# Patient Record
Sex: Female | Born: 1945 | Race: Black or African American | Hispanic: No | Marital: Single | Smoking: Former smoker
Health system: Southern US, Community
[De-identification: ages and names within clinical notes are randomized; demographics above are authoritative.]

## PROBLEM LIST (undated history)

## (undated) DIAGNOSIS — G709 Myoneural disorder, unspecified: Secondary | ICD-10-CM

## (undated) DIAGNOSIS — E785 Hyperlipidemia, unspecified: Secondary | ICD-10-CM

## (undated) DIAGNOSIS — M1990 Unspecified osteoarthritis, unspecified site: Secondary | ICD-10-CM

## (undated) DIAGNOSIS — I959 Hypotension, unspecified: Secondary | ICD-10-CM

## (undated) DIAGNOSIS — C801 Malignant (primary) neoplasm, unspecified: Secondary | ICD-10-CM

## (undated) DIAGNOSIS — E119 Type 2 diabetes mellitus without complications: Secondary | ICD-10-CM

## (undated) DIAGNOSIS — I10 Essential (primary) hypertension: Secondary | ICD-10-CM

## (undated) DIAGNOSIS — D649 Anemia, unspecified: Secondary | ICD-10-CM

## (undated) DIAGNOSIS — I82409 Acute embolism and thrombosis of unspecified deep veins of unspecified lower extremity: Secondary | ICD-10-CM

## (undated) DIAGNOSIS — E079 Disorder of thyroid, unspecified: Secondary | ICD-10-CM

## (undated) DIAGNOSIS — I739 Peripheral vascular disease, unspecified: Secondary | ICD-10-CM

## (undated) DIAGNOSIS — N189 Chronic kidney disease, unspecified: Secondary | ICD-10-CM

## (undated) DIAGNOSIS — I509 Heart failure, unspecified: Secondary | ICD-10-CM

## (undated) DIAGNOSIS — R0600 Dyspnea, unspecified: Secondary | ICD-10-CM

## (undated) DIAGNOSIS — I499 Cardiac arrhythmia, unspecified: Secondary | ICD-10-CM

## (undated) DIAGNOSIS — G4730 Sleep apnea, unspecified: Secondary | ICD-10-CM

## (undated) DIAGNOSIS — I639 Cerebral infarction, unspecified: Secondary | ICD-10-CM

## (undated) DIAGNOSIS — Z950 Presence of cardiac pacemaker: Secondary | ICD-10-CM

## (undated) HISTORY — DX: Peripheral vascular disease, unspecified: I73.9

## (undated) HISTORY — PX: PACEMAKER PLACEMENT: SHX43

## (undated) HISTORY — PX: AV FISTULA PLACEMENT: SHX1204

## (undated) HISTORY — DX: Myoneural disorder, unspecified: G70.9

## (undated) HISTORY — DX: Unspecified osteoarthritis, unspecified site: M19.90

## (undated) HISTORY — PX: ABDOMINAL HYSTERECTOMY: SHX81

## (undated) HISTORY — DX: Type 2 diabetes mellitus without complications: E11.9

## (undated) HISTORY — DX: Acute embolism and thrombosis of unspecified deep veins of unspecified lower extremity: I82.409

## (undated) HISTORY — DX: Chronic kidney disease, unspecified: N18.9

## (undated) HISTORY — PX: EYE SURGERY: SHX253

## (undated) HISTORY — DX: Heart failure, unspecified: I50.9

## (undated) HISTORY — PX: COLONOSCOPY W/ POLYPECTOMY: SHX1380

## (undated) HISTORY — DX: Hyperlipidemia, unspecified: E78.5

## (undated) HISTORY — DX: Essential (primary) hypertension: I10

## (undated) HISTORY — PX: ANKLE FUSION: SHX881

## (undated) HISTORY — DX: Disorder of thyroid, unspecified: E07.9

## (undated) HISTORY — PX: BREAST LUMPECTOMY: SHX2

## (undated) HISTORY — DX: Cerebral infarction, unspecified: I63.9

---

## 1999-03-22 ENCOUNTER — Encounter: Admission: RE | Admit: 1999-03-22 | Discharge: 1999-03-22 | Payer: Self-pay | Admitting: Family Medicine

## 1999-03-23 ENCOUNTER — Encounter: Payer: Self-pay | Admitting: Family Medicine

## 1999-03-23 ENCOUNTER — Ambulatory Visit (HOSPITAL_COMMUNITY): Admission: RE | Admit: 1999-03-23 | Discharge: 1999-03-23 | Payer: Self-pay | Admitting: Family Medicine

## 1999-03-24 ENCOUNTER — Encounter: Admission: RE | Admit: 1999-03-24 | Discharge: 1999-03-24 | Payer: Self-pay | Admitting: Family Medicine

## 1999-04-09 ENCOUNTER — Encounter: Payer: Self-pay | Admitting: General Surgery

## 1999-04-12 ENCOUNTER — Inpatient Hospital Stay (HOSPITAL_COMMUNITY): Admission: RE | Admit: 1999-04-12 | Discharge: 1999-04-21 | Payer: Self-pay | Admitting: General Surgery

## 1999-04-12 ENCOUNTER — Encounter: Payer: Self-pay | Admitting: General Surgery

## 1999-04-22 ENCOUNTER — Encounter: Admission: RE | Admit: 1999-04-22 | Discharge: 1999-04-22 | Payer: Self-pay | Admitting: Family Medicine

## 1999-05-04 ENCOUNTER — Encounter: Admission: RE | Admit: 1999-05-04 | Discharge: 1999-05-04 | Payer: Self-pay | Admitting: Family Medicine

## 1999-07-05 ENCOUNTER — Encounter: Admission: RE | Admit: 1999-07-05 | Discharge: 1999-07-05 | Payer: Self-pay | Admitting: Family Medicine

## 2000-07-03 ENCOUNTER — Encounter: Payer: Self-pay | Admitting: Emergency Medicine

## 2000-07-03 ENCOUNTER — Emergency Department (HOSPITAL_COMMUNITY): Admission: EM | Admit: 2000-07-03 | Discharge: 2000-07-03 | Payer: Self-pay | Admitting: Emergency Medicine

## 2000-07-06 ENCOUNTER — Encounter: Admission: RE | Admit: 2000-07-06 | Discharge: 2000-07-06 | Payer: Self-pay | Admitting: Hematology and Oncology

## 2006-12-28 ENCOUNTER — Ambulatory Visit (HOSPITAL_COMMUNITY): Admission: RE | Admit: 2006-12-28 | Discharge: 2006-12-29 | Payer: Self-pay | Admitting: Ophthalmology

## 2008-11-18 IMAGING — CR DG CHEST 2V
2 series · 2 of 2 positions shown · non-contrast
Comparison: Report dated 07/03/2000.

CLINICAL DATA: Traction retinal attachment. Preoperative evaluation.

CHEST - 2 VIEW

[view not recorded (1 of 2)]
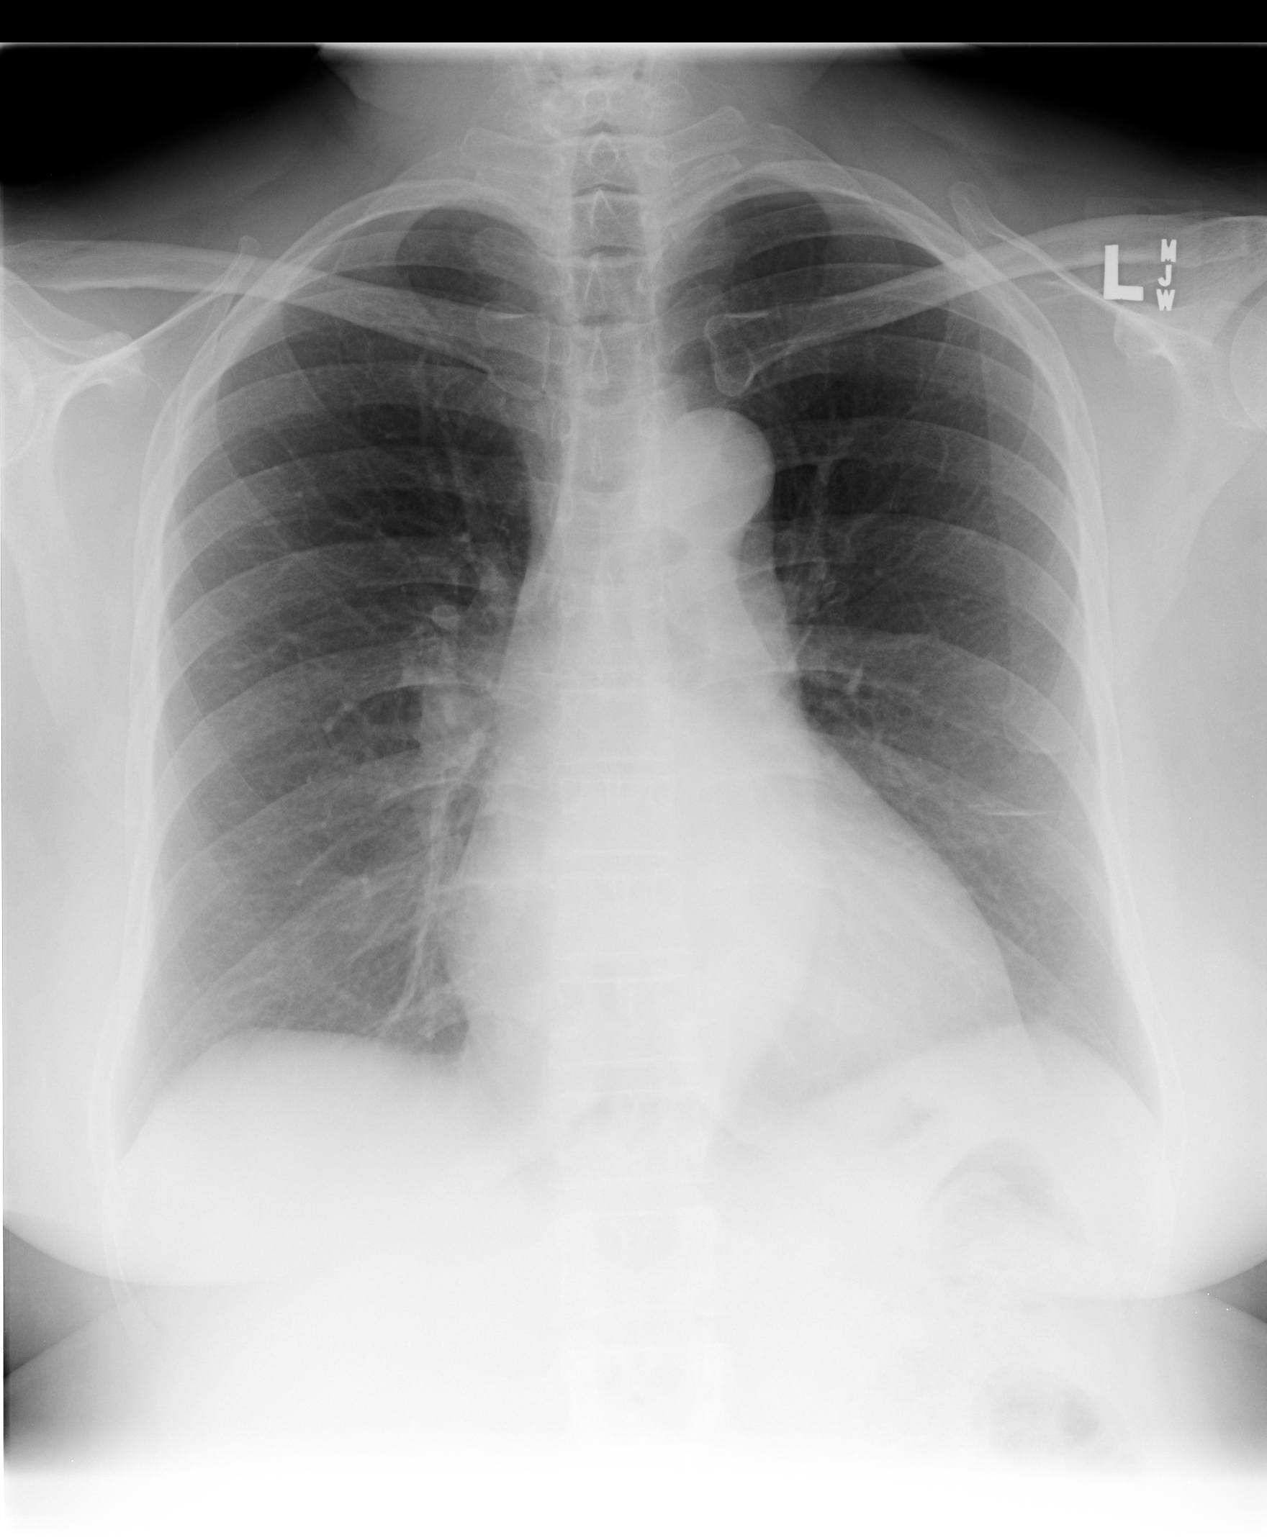

[view not recorded (2 of 2)]
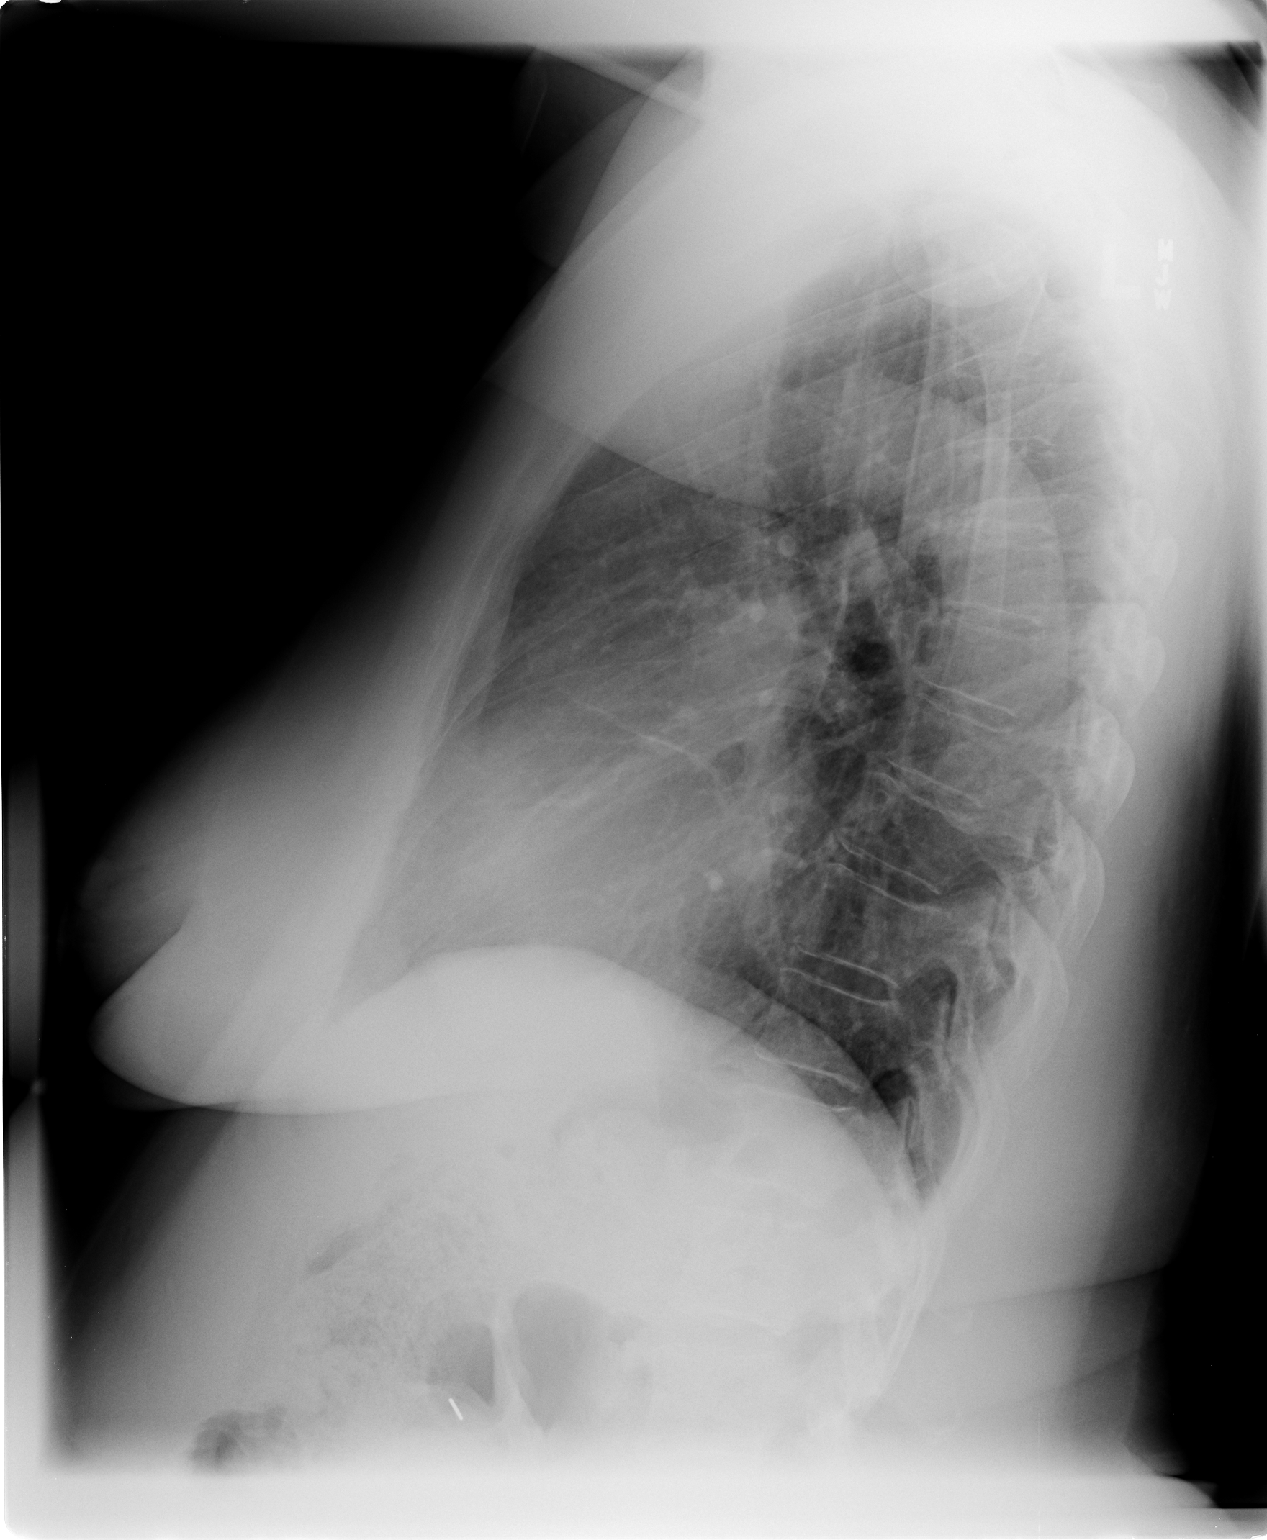

[2 of 2 positions shown; findings below may reference images not displayed]

FINDINGS: Mildly enlarged cardiac silhouette. Tortuous aorta. Small amount of
linear density in the lingula. Mild diffuse peribronchial thickening.
Unremarkable bones.

IMPRESSION

1. Mild cardiomegaly.
2. Mild chronic bronchitic changes.
3. Small amount of lingular scarring.

## 2012-04-19 DIAGNOSIS — Z86010 Personal history of colonic polyps: Secondary | ICD-10-CM | POA: Insufficient documentation

## 2012-11-08 DIAGNOSIS — E113599 Type 2 diabetes mellitus with proliferative diabetic retinopathy without macular edema, unspecified eye: Secondary | ICD-10-CM | POA: Insufficient documentation

## 2012-11-28 DIAGNOSIS — I4891 Unspecified atrial fibrillation: Secondary | ICD-10-CM | POA: Insufficient documentation

## 2013-03-05 DIAGNOSIS — Z7901 Long term (current) use of anticoagulants: Secondary | ICD-10-CM | POA: Insufficient documentation

## 2013-04-18 DIAGNOSIS — I872 Venous insufficiency (chronic) (peripheral): Secondary | ICD-10-CM | POA: Insufficient documentation

## 2013-04-18 DIAGNOSIS — E1149 Type 2 diabetes mellitus with other diabetic neurological complication: Secondary | ICD-10-CM | POA: Insufficient documentation

## 2014-08-07 DIAGNOSIS — Z981 Arthrodesis status: Secondary | ICD-10-CM | POA: Insufficient documentation

## 2014-08-07 DIAGNOSIS — E1149 Type 2 diabetes mellitus with other diabetic neurological complication: Secondary | ICD-10-CM | POA: Insufficient documentation

## 2014-12-26 DIAGNOSIS — N6092 Unspecified benign mammary dysplasia of left breast: Secondary | ICD-10-CM | POA: Insufficient documentation

## 2015-03-11 DIAGNOSIS — Z950 Presence of cardiac pacemaker: Secondary | ICD-10-CM

## 2015-03-11 HISTORY — DX: Presence of cardiac pacemaker: Z95.0

## 2015-03-21 HISTORY — PX: PACEMAKER PLACEMENT: SHX43

## 2016-07-21 ENCOUNTER — Encounter: Payer: Self-pay | Admitting: Vascular Surgery

## 2016-07-21 ENCOUNTER — Ambulatory Visit: Payer: Medicare HMO | Admitting: Vascular Surgery

## 2016-09-05 ENCOUNTER — Encounter: Payer: Self-pay | Admitting: Vascular Surgery

## 2016-09-09 ENCOUNTER — Ambulatory Visit (INDEPENDENT_AMBULATORY_CARE_PROVIDER_SITE_OTHER): Payer: Medicare HMO | Admitting: Vascular Surgery

## 2016-09-09 ENCOUNTER — Telehealth: Payer: Self-pay

## 2016-09-09 ENCOUNTER — Encounter: Payer: Self-pay | Admitting: Vascular Surgery

## 2016-09-09 VITALS — BP 150/72 | HR 60 | Temp 97.2°F | Resp 18 | Ht 63.0 in | Wt 180.0 lb

## 2016-09-09 DIAGNOSIS — Z992 Dependence on renal dialysis: Secondary | ICD-10-CM

## 2016-09-09 DIAGNOSIS — I871 Compression of vein: Secondary | ICD-10-CM | POA: Diagnosis not present

## 2016-09-09 DIAGNOSIS — T82898D Other specified complication of vascular prosthetic devices, implants and grafts, subsequent encounter: Secondary | ICD-10-CM

## 2016-09-09 DIAGNOSIS — T82510D Breakdown (mechanical) of surgically created arteriovenous fistula, subsequent encounter: Secondary | ICD-10-CM | POA: Insufficient documentation

## 2016-09-09 DIAGNOSIS — N186 End stage renal disease: Secondary | ICD-10-CM | POA: Diagnosis not present

## 2016-09-09 NOTE — Progress Notes (Signed)
Referred by:  Dr. Augustin Coupe (Nephrology)  Reason for referral: Left cephalic vein turndown  History of Present Illness  Stacy Hammond is a 70 y.o. ([birth year]) female who presents for evaluation for permanent access.  The patient is right hand dominant.  The patient current dialyzes via a L BC AVF.  She denies any bleeding complications or steal syndrome.  She recently had a L arm fistulogram with venoplasty.  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  Past Medical History:  Diagnosis Date  . Arthritis   . CHF (congestive heart failure) ([place])   . Chronic kidney disease   . Diabetes mellitus without complication ([place])   . DVT (deep venous thrombosis) ([place])   . Hyperlipidemia   . Hypertension   . Neuromuscular disorder ([place])   . Peripheral arterial disease ([place])   . Stroke ([place])   . Thyroid disease     Past Surgical History:  Procedure Laterality Date  . ABDOMINAL HYSTERECTOMY     partial  . ANKLE FUSION Right 2014?   done at [facility]  . AV FISTULA PLACEMENT    . BREAST LUMPECTOMY    . EYE SURGERY      Social History   Social History  . Marital status: Single    Spouse name: N/A  . Number of children: N/A  . Years of education: N/A   Occupational History  . Not on file.   Social History Main Topics  . Smoking status: Former Smoker    Types: Cigarettes    Quit date: 09/09/1986  . Smokeless tobacco: Never Used  . Alcohol use Not on file  . Drug use: Unknown  . Sexual activity: Not on file   Other Topics Concern  . Not on file   Social History Narrative  . No narrative on file    Family History: patient is unable to detail the medical history of his parents   Current Outpatient Prescriptions  Medication Sig Dispense Refill  . aspirin EC 81 MG tablet Take 81 mg by mouth.    . cinacalcet (SENSIPAR) 60 MG tablet     . docusate sodium (COLACE) 100 MG capsule Take 100 mg by mouth.    .  pravastatin (PRAVACHOL) 20 MG tablet Take 20 mg by mouth.    . sucroferric oxyhydroxide (VELPHORO) 500 MG chewable tablet Chew 1,000 mg by mouth 3 (three) times daily with meals.    . warfarin (COUMADIN) 5 MG tablet Take as directed.    . ferric citrate (AURYXIA) 1 GM 210 MG(Fe) tablet      No current facility-administered medications for this visit.     Allergies  Allergen Reactions  . Penicillins Hives  . Oxycodone Nausea And Vomiting and Nausea Only     REVIEW OF SYSTEMS:   Cardiac:  positive for: chest pain or pressure, irregular rate and rhythm, DOE, negative for: Shortness of breath when lying flat,   Vascular:  positive for: Pain in calf, thigh, or hip brought on by ambulation,  negative for: Pain in feet at night that wakes you up from your sleep, Blood clot in your veins and Leg swelling  Pulmonary:  positive for: no symptoms,  negative for: Oxygen at home, Productive cough and Wheezing  Neurologic:  positive for: Sudden weakness in arms or legs, negative for: Sudden numbness in arms or legs, Sudden onset of difficulty speaking or slurred speech, Temporary loss of vision in one  eye and Problems with dizziness  Gastrointestinal:  positive for: no symptoms, negative for: Blood in stool and Vomited blood  Genitourinary:  positive for: no symptoms, negative for: Burning when urinating and Blood in urine  Psychiatric:  positive for: no symptoms,  negative for: Major depression  Hematologic:  positive for: no symptoms,  negative for: negative for: Bleeding problems and Problems with blood clotting too easily  Dermatologic:  positive for: no symptoms, negative for: Rashes or ulcers  Constitutional:  positive for: no symptoms, negative for: Fever or chills  Ear/Nose/Throat:  positive for: no symptoms, negative for: Change in hearing, Nose bleeds and Sore throat  Musculoskeletal:  positive for: no symptoms, negative for: Back pain, Joint pain and Muscle  pain   Physical Examination  Vitals:   09/09/16 1039 09/09/16 1043  BP: (!) 174/84 (!) 150/72  Pulse: 60 60  Resp: 18   Temp: 97.2 F (36.2 C)   TempSrc: Oral   SpO2: 100%   Weight: 180 lb (81.6 kg)   Height: 5\' 3"  (1.6 m)     Body mass index is 31.89 kg/m.  General: Alert, O x 3, WD,NAD  Head: [place]/AT,   Ear/Nose/Throat: Hearing grossly intact, nares without erythema or drainage, oropharynx without Erythema or Exudate , Mallampati score: 3, Dentition intact  Eyes: PERRLA, EOMI,   Neck: Supple, mid-line trachea,    Pulmonary: Sym exp, good B air movt,CTA B  Cardiac: RRR, Nl S1, S2, no Murmurs, No rubs, No S3,S4  Vascular: Vessel Right Left  Radial Palpable Not palpable  Brachial Palpable Palpable  Carotid Palpable, No Bruit Palpable, No Bruit  Aorta Not palpable N/A  Femoral Palpable Palpable  Popliteal Not palpable Not palpable  PT Not palpable Not palpable  DP Not palpable Not palpable   Gastrointestinal: soft, non-distended, non-tender to palpation, No guarding or rebound, no HSM, no masses, no CVAT B, No palpable prominent aortic pulse,    Musculoskeletal: M/S 5/5 throughout  , Extremities without ischemic changes  , No edema present, No LDS present, L arm with strong bruit and thrill in the L BC AVF, pulsatile proximal fistula, on Sonosite: 5-6 mm brachial vein in axilla  Neurologic: CN 2-12 intact , Pain and light touch intact in extremities , Motor exam as listed above  Psychiatric: Judgement intact, Mood & affect appropriate for pt's clinical situation  Dermatologic: See M/S exam for extremity exam, No rashes otherwise noted  Lymph : Palpable lymph nodes: None  Outside L arm fistulogram Based my review of the fistulogram, the patient has a proximal cephalic stenosis at confluence and 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  The distal fistula is disease also.   Outside Studies/Documentation 7 pages of outside documents were reviewed including:  nephrology chart, fistulogram report.   Medical Decision Making  Stacy Hammond is a 70 y.o. female who presents with ESRD requiring hemodialysis, stenotic proximal cephalic vein    I agree that this patient's cephalic vein stenosis is best managed with a L cephalic vein turndown. Risk, benefits, and alternatives to access surgery were discussed.   The patient is aware the risks include but are not limited to: bleeding, infection, steal syndrome, nerve damage, ischemic monomelic neuropathy, failure to mature, need for additional procedures, death and stroke.  The patient agrees to proceed forward with the procedure.  The patient has agreed to proceed with the above procedure which will be scheduled 13 DEC 17.   Adele Barthel, MD [facility] Office:  [phone] Pager: [phone]  09/09/2016, 11:26 AM

## 2016-09-09 NOTE — Telephone Encounter (Signed)
Opened in error

## 2016-09-12 ENCOUNTER — Other Ambulatory Visit: Payer: Self-pay

## 2016-09-13 ENCOUNTER — Encounter (HOSPITAL_COMMUNITY): Payer: Self-pay | Admitting: *Deleted

## 2016-09-13 NOTE — Anesthesia Preprocedure Evaluation (Addendum)
Anesthesia Evaluation  Patient identified by MRN, date of birth, ID band Patient awake    Reviewed: Allergy & Precautions, NPO status , Patient's Chart, lab work & pertinent test results  History of Anesthesia Complications Negative for: history of anesthetic complications  Airway Mallampati: II  TM Distance: >3 FB Neck ROM: Full    Dental  (+) Partial Lower, Partial Upper, Dental Advisory Given   Pulmonary neg pulmonary ROS, former smoker,    Pulmonary exam normal        Cardiovascular hypertension, + Peripheral Vascular Disease and +CHF  negative cardio ROS Normal cardiovascular exam+ dysrhythmias Atrial Fibrillation + pacemaker      Neuro/Psych CVA    GI/Hepatic negative GI ROS, Neg liver ROS,   Endo/Other  negative endocrine ROSdiabetes  Renal/GU ESRF and DialysisRenal disease     Musculoskeletal negative musculoskeletal ROS (+)   Abdominal   Peds  Hematology negative hematology ROS (+)   Anesthesia Other Findings Day of surgery medications reviewed with the patient.  Reproductive/Obstetrics                            Anesthesia Physical Anesthesia Plan  ASA: III  Anesthesia Plan: General   Post-op Pain Management:    Induction: Intravenous  Airway Management Planned: LMA  Additional Equipment:   Intra-op Plan:   Post-operative Plan: Extubation in OR  Informed Consent: I have reviewed the patients History and Physical, chart, labs and discussed the procedure including the risks, benefits and alternatives for the proposed anesthesia with the patient or authorized representative who has indicated his/her understanding and acceptance.   Dental advisory given  Plan Discussed with: CRNA and Anesthesiologist  Anesthesia Plan Comments:       Anesthesia Quick Evaluation

## 2016-09-13 NOTE — Progress Notes (Signed)
There are instuctions from Dr Bridgett Larsson office requesting that we call daughter Denman George.  Denman George reports that patient just arrived home from Hemodialysis and is sleeping and generally  sleeps until after 7 PM. Lolanda verified patients name and DOB and surgical procedure.   Denman George reports that patient has had been expericing  dizziness for a while, denies shortness of breath or nausea with the dizziness. Adrian Prows states that patient has an appointment with cardiologist in January, she states that only the PCP is aware of dizziness.  I find in notes from cardiologist that the dizziness was discussed at 12/2015 appointment and he was going to have her follow up with nephrologist.

## 2016-09-14 ENCOUNTER — Ambulatory Visit (HOSPITAL_COMMUNITY)
Admission: RE | Admit: 2016-09-14 | Discharge: 2016-09-14 | Disposition: A | Discharge status: Home / Self Care | Payer: Medicare HMO | Source: Ambulatory Visit | Attending: Vascular Surgery | Admitting: Vascular Surgery

## 2016-09-14 ENCOUNTER — Encounter (HOSPITAL_COMMUNITY): Payer: Self-pay | Admitting: *Deleted

## 2016-09-14 ENCOUNTER — Ambulatory Visit (HOSPITAL_COMMUNITY): Payer: Medicare HMO | Admitting: Anesthesiology

## 2016-09-14 ENCOUNTER — Encounter (HOSPITAL_COMMUNITY)
Admission: RE | Disposition: A | Discharge status: Home / Self Care | Payer: Self-pay | Source: Ambulatory Visit | Attending: Vascular Surgery

## 2016-09-14 DIAGNOSIS — I4891 Unspecified atrial fibrillation: Secondary | ICD-10-CM | POA: Diagnosis not present

## 2016-09-14 DIAGNOSIS — Z87891 Personal history of nicotine dependence: Secondary | ICD-10-CM | POA: Insufficient documentation

## 2016-09-14 DIAGNOSIS — T82898A Other specified complication of vascular prosthetic devices, implants and grafts, initial encounter: Secondary | ICD-10-CM | POA: Diagnosis not present

## 2016-09-14 DIAGNOSIS — X58XXXA Exposure to other specified factors, initial encounter: Secondary | ICD-10-CM | POA: Diagnosis not present

## 2016-09-14 DIAGNOSIS — Z79899 Other long term (current) drug therapy: Secondary | ICD-10-CM | POA: Diagnosis not present

## 2016-09-14 DIAGNOSIS — E079 Disorder of thyroid, unspecified: Secondary | ICD-10-CM | POA: Insufficient documentation

## 2016-09-14 DIAGNOSIS — Z950 Presence of cardiac pacemaker: Secondary | ICD-10-CM | POA: Insufficient documentation

## 2016-09-14 DIAGNOSIS — Z992 Dependence on renal dialysis: Secondary | ICD-10-CM | POA: Diagnosis not present

## 2016-09-14 DIAGNOSIS — Z7982 Long term (current) use of aspirin: Secondary | ICD-10-CM | POA: Diagnosis not present

## 2016-09-14 DIAGNOSIS — Z8673 Personal history of transient ischemic attack (TIA), and cerebral infarction without residual deficits: Secondary | ICD-10-CM | POA: Diagnosis not present

## 2016-09-14 DIAGNOSIS — E1122 Type 2 diabetes mellitus with diabetic chronic kidney disease: Secondary | ICD-10-CM | POA: Diagnosis not present

## 2016-09-14 DIAGNOSIS — Z880 Allergy status to penicillin: Secondary | ICD-10-CM | POA: Insufficient documentation

## 2016-09-14 DIAGNOSIS — Z885 Allergy status to narcotic agent status: Secondary | ICD-10-CM | POA: Diagnosis not present

## 2016-09-14 DIAGNOSIS — G709 Myoneural disorder, unspecified: Secondary | ICD-10-CM | POA: Insufficient documentation

## 2016-09-14 DIAGNOSIS — Z7901 Long term (current) use of anticoagulants: Secondary | ICD-10-CM | POA: Diagnosis not present

## 2016-09-14 DIAGNOSIS — E1151 Type 2 diabetes mellitus with diabetic peripheral angiopathy without gangrene: Secondary | ICD-10-CM | POA: Diagnosis not present

## 2016-09-14 DIAGNOSIS — Z86718 Personal history of other venous thrombosis and embolism: Secondary | ICD-10-CM | POA: Diagnosis not present

## 2016-09-14 DIAGNOSIS — N186 End stage renal disease: Secondary | ICD-10-CM | POA: Diagnosis present

## 2016-09-14 DIAGNOSIS — I132 Hypertensive heart and chronic kidney disease with heart failure and with stage 5 chronic kidney disease, or end stage renal disease: Secondary | ICD-10-CM | POA: Diagnosis not present

## 2016-09-14 DIAGNOSIS — Z90710 Acquired absence of both cervix and uterus: Secondary | ICD-10-CM | POA: Insufficient documentation

## 2016-09-14 DIAGNOSIS — E785 Hyperlipidemia, unspecified: Secondary | ICD-10-CM | POA: Diagnosis not present

## 2016-09-14 DIAGNOSIS — I82612 Acute embolism and thrombosis of superficial veins of left upper extremity: Secondary | ICD-10-CM | POA: Diagnosis not present

## 2016-09-14 DIAGNOSIS — I509 Heart failure, unspecified: Secondary | ICD-10-CM | POA: Diagnosis not present

## 2016-09-14 HISTORY — DX: Presence of cardiac pacemaker: Z95.0

## 2016-09-14 HISTORY — DX: Dyspnea, unspecified: R06.00

## 2016-09-14 HISTORY — DX: Malignant (primary) neoplasm, unspecified: C80.1

## 2016-09-14 HISTORY — PX: REVISON OF ARTERIOVENOUS FISTULA: SHX6074

## 2016-09-14 LAB — GLUCOSE, CAPILLARY
GLUCOSE-CAPILLARY: 72 mg/dL (ref 65–99)
GLUCOSE-CAPILLARY: 96 mg/dL (ref 65–99)

## 2016-09-14 LAB — POCT I-STAT 4, (NA,K, GLUC, HGB,HCT)
GLUCOSE: 86 mg/dL (ref 65–99)
HCT: 37 % (ref 36.0–46.0)
Hemoglobin: 12.6 g/dL (ref 12.0–15.0)
Potassium: 3.5 mmol/L (ref 3.5–5.1)
Sodium: 141 mmol/L (ref 135–145)

## 2016-09-14 LAB — PROTIME-INR
INR: 1.07
PROTHROMBIN TIME: 13.9 s (ref 11.4–15.2)

## 2016-09-14 SURGERY — REVISON OF ARTERIOVENOUS FISTULA
Anesthesia: General | Site: Arm Upper | Laterality: Left

## 2016-09-14 MED ORDER — VANCOMYCIN HCL IN DEXTROSE 1-5 GM/200ML-% IV SOLN
INTRAVENOUS | Status: AC
  Filled 2016-09-14: qty 200

## 2016-09-14 MED ORDER — VANCOMYCIN HCL IN DEXTROSE 1-5 GM/200ML-% IV SOLN
1000.0000 mg | INTRAVENOUS | Status: AC
  Administered 2016-09-14: 1000 mg via INTRAVENOUS

## 2016-09-14 MED ORDER — LIDOCAINE HCL (CARDIAC) 20 MG/ML IV SOLN
INTRAVENOUS | Status: DC | PRN
  Administered 2016-09-14: 100 mg via INTRAVENOUS

## 2016-09-14 MED ORDER — OXYCODONE-ACETAMINOPHEN 5-325 MG PO TABS: 1.0000 | ORAL_TABLET | Freq: Four times a day (QID) | ORAL | 0 refills | Status: DC | PRN

## 2016-09-14 MED ORDER — PHENYLEPHRINE HCL 10 MG/ML IJ SOLN
INTRAMUSCULAR | Status: DC | PRN
  Administered 2016-09-14 (×2): 40 ug via INTRAVENOUS
  Administered 2016-09-14: 80 ug via INTRAVENOUS
  Administered 2016-09-14: 40 ug via INTRAVENOUS
  Administered 2016-09-14: 80 ug via INTRAVENOUS

## 2016-09-14 MED ORDER — MIDAZOLAM HCL 2 MG/2ML IJ SOLN
INTRAMUSCULAR | Status: AC
  Filled 2016-09-14: qty 2

## 2016-09-14 MED ORDER — PROPOFOL 10 MG/ML IV BOLUS
INTRAVENOUS | Status: DC | PRN
  Administered 2016-09-14: 140 mg via INTRAVENOUS
  Administered 2016-09-14: 10 mg via INTRAVENOUS

## 2016-09-14 MED ORDER — LIDOCAINE 2% (20 MG/ML) 5 ML SYRINGE
INTRAMUSCULAR | Status: AC
  Filled 2016-09-14: qty 5

## 2016-09-14 MED ORDER — MIDAZOLAM HCL 5 MG/5ML IJ SOLN
INTRAMUSCULAR | Status: DC | PRN
  Administered 2016-09-14: 1 mg via INTRAVENOUS

## 2016-09-14 MED ORDER — ONDANSETRON HCL 4 MG/2ML IJ SOLN
INTRAMUSCULAR | Status: DC | PRN
  Administered 2016-09-14: 4 mg via INTRAVENOUS

## 2016-09-14 MED ORDER — 0.9 % SODIUM CHLORIDE (POUR BTL) OPTIME
TOPICAL | Status: DC | PRN
  Administered 2016-09-14: 1000 mL

## 2016-09-14 MED ORDER — FENTANYL CITRATE (PF) 100 MCG/2ML IJ SOLN
INTRAMUSCULAR | Status: AC
  Filled 2016-09-14: qty 2

## 2016-09-14 MED ORDER — ARTIFICIAL TEARS OP OINT
TOPICAL_OINTMENT | OPHTHALMIC | Status: AC
  Filled 2016-09-14: qty 3.5

## 2016-09-14 MED ORDER — SODIUM CHLORIDE 0.9 % IV SOLN
INTRAVENOUS | Status: DC
  Administered 2016-09-14 (×3): via INTRAVENOUS

## 2016-09-14 MED ORDER — HEMOSTATIC AGENTS (NO CHARGE) OPTIME
TOPICAL | Status: DC | PRN
  Administered 2016-09-14: 1 via TOPICAL

## 2016-09-14 MED ORDER — ONDANSETRON HCL 4 MG/2ML IJ SOLN
INTRAMUSCULAR | Status: AC
  Filled 2016-09-14: qty 2

## 2016-09-14 MED ORDER — LIDOCAINE HCL (PF) 1 % IJ SOLN
INTRAMUSCULAR | Status: DC | PRN
  Administered 2016-09-14: 30 mL

## 2016-09-14 MED ORDER — PROMETHAZINE HCL 25 MG/ML IJ SOLN: 6.2500 mg | INTRAMUSCULAR | Status: DC | PRN

## 2016-09-14 MED ORDER — FENTANYL CITRATE (PF) 100 MCG/2ML IJ SOLN
INTRAMUSCULAR | Status: DC | PRN
Start: 2016-09-14 — End: 2016-09-14
  Administered 2016-09-14 (×2): 50 ug via INTRAVENOUS

## 2016-09-14 MED ORDER — PROPOFOL 10 MG/ML IV BOLUS
INTRAVENOUS | Status: AC
  Filled 2016-09-14: qty 40

## 2016-09-14 MED ORDER — WARFARIN SODIUM 5 MG PO TABS: 5.0000 mg | ORAL_TABLET | Freq: Every day | ORAL | Status: DC

## 2016-09-14 MED ORDER — LIDOCAINE HCL (PF) 1 % IJ SOLN
INTRAMUSCULAR | Status: AC
  Filled 2016-09-14: qty 30

## 2016-09-14 MED ORDER — SODIUM CHLORIDE 0.9 % IV SOLN
INTRAVENOUS | Status: DC | PRN
  Administered 2016-09-14: 09:00:00

## 2016-09-14 MED ORDER — CHLORHEXIDINE GLUCONATE CLOTH 2 % EX PADS: 6.0000 | MEDICATED_PAD | Freq: Once | CUTANEOUS | Status: DC

## 2016-09-14 MED ORDER — HYDROMORPHONE HCL 1 MG/ML IJ SOLN: 0.2500 mg | INTRAMUSCULAR | Status: DC | PRN

## 2016-09-14 SURGICAL SUPPLY — 34 items
ARMBAND PINK RESTRICT EXTREMIT (MISCELLANEOUS) ×3 IMPLANT
CANISTER SUCTION 2500CC (MISCELLANEOUS) ×3 IMPLANT
CLIP TI MEDIUM 6 (CLIP) ×3 IMPLANT
CLIP TI WIDE RED SMALL 6 (CLIP) ×3 IMPLANT
COVER PROBE W GEL 5X96 (DRAPES) IMPLANT
DECANTER SPIKE VIAL GLASS SM (MISCELLANEOUS) ×3 IMPLANT
DERMABOND ADVANCED (GAUZE/BANDAGES/DRESSINGS) ×2
DERMABOND ADVANCED .7 DNX12 (GAUZE/BANDAGES/DRESSINGS) ×1 IMPLANT
DRAIN PENROSE 1/2X12 LTX STRL (WOUND CARE) IMPLANT
ELECT REM PT RETURN 9FT ADLT (ELECTROSURGICAL) ×3
ELECTRODE REM PT RTRN 9FT ADLT (ELECTROSURGICAL) ×1 IMPLANT
GLOVE BIO SURGEON STRL SZ7 (GLOVE) ×3 IMPLANT
GLOVE BIOGEL M 6.5 STRL (GLOVE) ×6 IMPLANT
GLOVE BIOGEL PI IND STRL 6.5 (GLOVE) ×2 IMPLANT
GLOVE BIOGEL PI IND STRL 7.0 (GLOVE) ×2 IMPLANT
GLOVE BIOGEL PI IND STRL 7.5 (GLOVE) ×1 IMPLANT
GLOVE BIOGEL PI INDICATOR 6.5 (GLOVE) ×4
GLOVE BIOGEL PI INDICATOR 7.0 (GLOVE) ×4
GLOVE BIOGEL PI INDICATOR 7.5 (GLOVE) ×2
GOWN STRL REUS W/ TWL LRG LVL3 (GOWN DISPOSABLE) ×3 IMPLANT
GOWN STRL REUS W/TWL LRG LVL3 (GOWN DISPOSABLE) ×6
HEMOSTAT SPONGE AVITENE ULTRA (HEMOSTASIS) ×3 IMPLANT
KIT BASIN OR (CUSTOM PROCEDURE TRAY) ×3 IMPLANT
KIT ROOM TURNOVER OR (KITS) ×3 IMPLANT
NS IRRIG 1000ML POUR BTL (IV SOLUTION) ×3 IMPLANT
PACK CV ACCESS (CUSTOM PROCEDURE TRAY) ×3 IMPLANT
PAD ARMBOARD 7.5X6 YLW CONV (MISCELLANEOUS) ×6 IMPLANT
SUT MNCRL AB 4-0 PS2 18 (SUTURE) ×6 IMPLANT
SUT PROLENE 6 0 BV (SUTURE) ×6 IMPLANT
SUT PROLENE 7 0 BV 1 (SUTURE) ×3 IMPLANT
SUT VIC AB 3-0 SH 27 (SUTURE) ×4
SUT VIC AB 3-0 SH 27X BRD (SUTURE) ×2 IMPLANT
UNDERPAD 30X30 (UNDERPADS AND DIAPERS) ×3 IMPLANT
WATER STERILE IRR 1000ML POUR (IV SOLUTION) ×3 IMPLANT

## 2016-09-14 NOTE — Interval H&P Note (Signed)
[facility] [place]  History and Physical Update  The patient was interviewed and re-examined.  The patient's previous History and Physical has been reviewed and is unchanged from my consult.  There is no change in the plan of care: L cephalic vein turndown.   Risk, benefits, and alternatives to access surgery were discussed.    The patient is aware the risks include but are not limited to: bleeding, infection, steal syndrome, nerve damage, ischemic monomelic neuropathy, thrombosis, failure to mature, need for additional procedures, death and stroke.    The patient agrees to proceed forward with the procedure.   Adele Barthel, MD [facility] [place] Office: [phone] Pager: [phone]  09/14/2016, 8:11 AM

## 2016-09-14 NOTE — Anesthesia Procedure Notes (Signed)
Procedure Name: LMA Insertion Date/Time: 09/14/2016 8:42 AM Performed by: Scheryl Darter Pre-anesthesia Checklist: Patient identified, Emergency Drugs available, Suction available and Patient being monitored Patient Re-evaluated:Patient Re-evaluated prior to inductionOxygen Delivery Method: Circle System Utilized Preoxygenation: Pre-oxygenation with 100% oxygen Intubation Type: IV induction Ventilation: Mask ventilation without difficulty LMA: LMA inserted LMA Size: 4.0 Number of attempts: 1 Placement Confirmation: positive ETCO2 Tube secured with: Tape Dental Injury: Teeth and Oropharynx as per pre-operative assessment

## 2016-09-14 NOTE — Anesthesia Postprocedure Evaluation (Signed)
Anesthesia Post Note  Patient: Stacy Hammond  Procedure(s) Performed: Procedure(s) (LRB): LEFT CEPHALIC TURNDOWN (Left)  Patient location during evaluation: PACU Anesthesia Type: General Level of consciousness: sedated Pain management: pain level controlled Vital Signs Assessment: post-procedure vital signs reviewed and stable Respiratory status: spontaneous breathing and respiratory function stable Cardiovascular status: stable Anesthetic complications: no    Last Vitals:  Vitals:   09/14/16 1110 09/14/16 1122  BP: (!) 157/65 (!) 148/59  Pulse: (!) 59 (!) 59  Resp: 13 13  Temp:  36.5 C    Last Pain:  Vitals:   09/14/16 1122  PainSc: 0-No pain                 Kohana Amble DANIEL

## 2016-09-14 NOTE — Op Note (Signed)
OPERATIVE NOTE   PROCEDURE: 1. Left cephalic vein turndown (revision of left brachiocephalic arteriovenous fistula with cephalic to brachial venous anastomosis)  PRE-OPERATIVE DIAGNOSIS: central cephalic vein sub-total occlusion, end stage renal disease  POST-OPERATIVE DIAGNOSIS: same as above   SURGEON: Adele Barthel, MD  ASSISTANT(S): Silva Bandy, PAC   ANESTHESIA: general  ESTIMATED BLOOD LOSS: 50 cc  FINDING(S): 1.  Proximal cephalic vein 6 mm 2.  Target brachial vein 5.5-6 mm 3.  Greatly improved thrill at end of case 4.  Faintly dopplerable left radial signal  SPECIMEN(S):  none  INDICATIONS:   Stacy Hammond is a 70 y.o. female who presents with end stage renal disease and sub-total occlusion of left cephalic vein.  She recently underwent venoplasty of the central cephalic vein with some response.  Based on my review of the images, I felt a left cephalic vein turndown was indicated to improve the long-term patency of this patient's left brachiocephalic arteriovenous fistula.  Risk, benefits, and alternatives to access surgery were discussed.  The patient is aware the risks include but are not limited to: bleeding, infection, steal syndrome, nerve damage, ischemic monomelic neuropathy, thrombosis, failure to mature, need for additional procedures, death and stroke.  The patient agrees to proceed forward with the procedure.   DESCRIPTION: After obtaining full informed written consent, the patient was brought back to the operating room and placed supine upon the operating table.  The patient received IV antibiotics prior to induction.  After obtaining adequate anesthesia, the patient was prepped and draped in the standard fashion for: left arm access procedure.  I identified the proximal cephalic vein and adequate brachial vein under Sonosite guidance.  I made a longitudinal incision over the proximal cephalic vein and dissected out this vein with electrocautery.  The vein had  some sclerotic tissue around a segment, but with gentle hydrodistension this dilated up to 6 mm without difficulty.  This entire exposed fistula was 6 mm.    At this point, I turned my attention to the left axilla.  I made an incision over over the left brachial vein.  Using blunt dissection and electrocautery, I dissected out the brachial vein.  It appeared to be 5.5-6.0 mm in diameter externally.  I dissected from the axillary exposure to the proximal cephalic vein exposure in the subcutaneous tissue superficial to the fascia.    I tied of the proximal extent of the cephalic vein at the level of the shoulder.  I marked the orientation of the vein with a marker.  I clamped the fistula distally and then transected the vein proximally.  I passed the vein through the subcutaneous tunnel, taking care to maintain the orientation of the fistula.  I clamped the brachial vein proximally and distally.  I made a venotomy and extended it proximally and distally.  I sharply shortened the vein length and then sewed the cephalic vein to the brachial vein in an end-to-side anastomosis with a running stitch of 6-0 Prolene.  Prior to completing this anastomosis, I backbled both ends of the brachial vein: no thrombus was noted.  I also the fistula to backbled: no thrombus was noted.  I completed this anastomosis in the usual fashion.  I released all clamps.  I washed out all incisions and packed each incision with Avitene.  After waiting a few minutes, there was minimal bleeding.  I controlled a few bleeding points with electrocautery.    At this point, each incision was closed with a double  layer of 3-0 Vicryl and a running subcuticular stitch of 4-0 Monocryl.  The skin was cleaned, dried, and reinforced with Dermabond.  There was a greatly improved palpable thrill at the end of the case and weakly dopplerable left radial signal.   COMPLICATIONS: none  CONDITION: stable   Adele Barthel, MD [facility] [place] Office: [phone] Pager: [phone]  09/14/2016, 10:10 AM

## 2016-09-14 NOTE — H&P (View-Only) (Signed)
Referred by:  Dr. Augustin Coupe (Nephrology)  Reason for referral: Left cephalic vein turndown  History of Present Illness  Stacy Hammond is a 70 y.o. (1946-07-24) female who presents for evaluation for permanent access.  The patient is right hand dominant.  The patient current dialyzes via a L BC AVF.  She denies any bleeding complications or steal syndrome.  She recently had a L arm fistulogram with venoplasty.  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  Past Medical History:  Diagnosis Date  . Arthritis   . CHF (congestive heart failure) ([place])   . Chronic kidney disease   . Diabetes mellitus without complication ([place])   . DVT (deep venous thrombosis) ([place])   . Hyperlipidemia   . Hypertension   . Neuromuscular disorder ([place])   . Peripheral arterial disease ([place])   . Stroke ([place])   . Thyroid disease     Past Surgical History:  Procedure Laterality Date  . ABDOMINAL HYSTERECTOMY     partial  . ANKLE FUSION Right 2014?   done at [facility]  . AV FISTULA PLACEMENT    . BREAST LUMPECTOMY    . EYE SURGERY      Social History   Social History  . Marital status: Single    Spouse name: N/A  . Number of children: N/A  . Years of education: N/A   Occupational History  . Not on file.   Social History Main Topics  . Smoking status: Former Smoker    Types: Cigarettes    Quit date: 09/09/1986  . Smokeless tobacco: Never Used  . Alcohol use Not on file  . Drug use: Unknown  . Sexual activity: Not on file   Other Topics Concern  . Not on file   Social History Narrative  . No narrative on file    Family History: patient is unable to detail the medical history of his parents   Current Outpatient Prescriptions  Medication Sig Dispense Refill  . aspirin EC 81 MG tablet Take 81 mg by mouth.    . cinacalcet (SENSIPAR) 60 MG tablet     . docusate sodium (COLACE) 100 MG capsule Take 100 mg by mouth.    .  pravastatin (PRAVACHOL) 20 MG tablet Take 20 mg by mouth.    . sucroferric oxyhydroxide (VELPHORO) 500 MG chewable tablet Chew 1,000 mg by mouth 3 (three) times daily with meals.    . warfarin (COUMADIN) 5 MG tablet Take as directed.    . ferric citrate (AURYXIA) 1 GM 210 MG(Fe) tablet      No current facility-administered medications for this visit.     Allergies  Allergen Reactions  . Penicillins Hives  . Oxycodone Nausea And Vomiting and Nausea Only     REVIEW OF SYSTEMS:   Cardiac:  positive for: chest pain or pressure, irregular rate and rhythm, DOE, negative for: Shortness of breath when lying flat,   Vascular:  positive for: Pain in calf, thigh, or hip brought on by ambulation,  negative for: Pain in feet at night that wakes you up from your sleep, Blood clot in your veins and Leg swelling  Pulmonary:  positive for: no symptoms,  negative for: Oxygen at home, Productive cough and Wheezing  Neurologic:  positive for: Sudden weakness in arms or legs, negative for: Sudden numbness in arms or legs, Sudden onset of difficulty speaking or slurred speech, Temporary loss of vision in one  eye and Problems with dizziness  Gastrointestinal:  positive for: no symptoms, negative for: Blood in stool and Vomited blood  Genitourinary:  positive for: no symptoms, negative for: Burning when urinating and Blood in urine  Psychiatric:  positive for: no symptoms,  negative for: Major depression  Hematologic:  positive for: no symptoms,  negative for: negative for: Bleeding problems and Problems with blood clotting too easily  Dermatologic:  positive for: no symptoms, negative for: Rashes or ulcers  Constitutional:  positive for: no symptoms, negative for: Fever or chills  Ear/Nose/Throat:  positive for: no symptoms, negative for: Change in hearing, Nose bleeds and Sore throat  Musculoskeletal:  positive for: no symptoms, negative for: Back pain, Joint pain and Muscle  pain   Physical Examination  Vitals:   09/09/16 1039 09/09/16 1043  BP: (!) 174/84 (!) 150/72  Pulse: 60 60  Resp: 18   Temp: 97.2 F (36.2 C)   TempSrc: Oral   SpO2: 100%   Weight: 180 lb (81.6 kg)   Height: 5\' 3"  (1.6 m)     Body mass index is 31.89 kg/m.  General: Alert, O x 3, WD,NAD  Head: [place]/AT,   Ear/Nose/Throat: Hearing grossly intact, nares without erythema or drainage, oropharynx without Erythema or Exudate , Mallampati score: 3, Dentition intact  Eyes: PERRLA, EOMI,   Neck: Supple, mid-line trachea,    Pulmonary: Sym exp, good B air movt,CTA B  Cardiac: RRR, Nl S1, S2, no Murmurs, No rubs, No S3,S4  Vascular: Vessel Right Left  Radial Palpable Not palpable  Brachial Palpable Palpable  Carotid Palpable, No Bruit Palpable, No Bruit  Aorta Not palpable N/A  Femoral Palpable Palpable  Popliteal Not palpable Not palpable  PT Not palpable Not palpable  DP Not palpable Not palpable   Gastrointestinal: soft, non-distended, non-tender to palpation, No guarding or rebound, no HSM, no masses, no CVAT B, No palpable prominent aortic pulse,    Musculoskeletal: M/S 5/5 throughout  , Extremities without ischemic changes  , No edema present, No LDS present, L arm with strong bruit and thrill in the L BC AVF, pulsatile proximal fistula, on Sonosite: 5-6 mm brachial vein in axilla  Neurologic: CN 2-12 intact , Pain and light touch intact in extremities , Motor exam as listed above  Psychiatric: Judgement intact, Mood & affect appropriate for pt's clinical situation  Dermatologic: See M/S exam for extremity exam, No rashes otherwise noted  Lymph : Palpable lymph nodes: None  Outside L arm fistulogram Based my review of the fistulogram, the patient has a proximal cephalic stenosis at confluence and 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  The distal fistula is disease also.   Outside Studies/Documentation 7 pages of outside documents were reviewed including:  nephrology chart, fistulogram report.   Medical Decision Making  Stacy Hammond is a 70 y.o. female who presents with ESRD requiring hemodialysis, stenotic proximal cephalic vein    I agree that this patient's cephalic vein stenosis is best managed with a L cephalic vein turndown. Risk, benefits, and alternatives to access surgery were discussed.   The patient is aware the risks include but are not limited to: bleeding, infection, steal syndrome, nerve damage, ischemic monomelic neuropathy, failure to mature, need for additional procedures, death and stroke.  The patient agrees to proceed forward with the procedure.  The patient has agreed to proceed with the above procedure which will be scheduled 13 DEC 17.   Adele Barthel, MD [facility] Office:  [phone] Pager: [phone]  09/09/2016, 11:26 AM

## 2016-09-14 NOTE — Transfer of Care (Signed)
Immediate Anesthesia Transfer of Care Note  Patient: Stacy Hammond  Procedure(s) Performed: Procedure(s): LEFT CEPHALIC TURNDOWN (Left)  Patient Location: PACU  Anesthesia Type:General  Level of Consciousness: awake, alert , oriented and sedated  Airway & Oxygen Therapy: Patient Spontanous Breathing and Patient connected to nasal cannula oxygen  Post-op Assessment: Report given to RN, Post -op Vital signs reviewed and stable and Patient moving all extremities  Post vital signs: Reviewed and stable  Last Vitals:  Vitals:   09/14/16 0656  BP: (!) 193/66  Pulse: (!) 59  Resp: 18  Temp: 37 C    Last Pain: There were no vitals filed for this visit.    Patients Stated Pain Goal: 5 (25/42/70 6237)  Complications: No apparent anesthesia complications

## 2016-09-15 ENCOUNTER — Encounter (HOSPITAL_COMMUNITY): Payer: Self-pay | Admitting: Vascular Surgery

## 2016-09-23 ENCOUNTER — Encounter: Payer: Self-pay | Admitting: Vascular Surgery

## 2016-10-18 ENCOUNTER — Encounter: Payer: Self-pay | Admitting: Vascular Surgery

## 2017-02-08 ENCOUNTER — Encounter: Payer: Self-pay | Admitting: Nephrology

## 2018-05-14 ENCOUNTER — Other Ambulatory Visit: Payer: Self-pay

## 2018-05-14 ENCOUNTER — Ambulatory Visit (INDEPENDENT_AMBULATORY_CARE_PROVIDER_SITE_OTHER): Payer: Medicare HMO | Admitting: Surgery

## 2018-05-14 ENCOUNTER — Encounter: Payer: Self-pay | Admitting: *Deleted

## 2018-05-14 ENCOUNTER — Other Ambulatory Visit: Payer: Self-pay | Admitting: *Deleted

## 2018-05-14 ENCOUNTER — Encounter: Payer: Self-pay | Admitting: Surgery

## 2018-05-14 VITALS — BP 174/85 | HR 74 | Resp 18 | Ht 63.0 in

## 2018-05-14 DIAGNOSIS — Z992 Dependence on renal dialysis: Secondary | ICD-10-CM | POA: Diagnosis not present

## 2018-05-14 DIAGNOSIS — N186 End stage renal disease: Secondary | ICD-10-CM | POA: Diagnosis not present

## 2018-05-14 NOTE — Progress Notes (Signed)
[facility] [place]  Patient name: Stacy Hammond MRN: [id number] DOB: 17-May-1946 Sex: female   REASON FOR VISIT:    Aneurysmal fisatula  HISOTRY OF PRESENT ILLNESS:    Stacy Hammond is a 72 y.o. female who is status post left brachiocephalic fistula creation.  She underwent a cephalic vein turndown for a subtotal occlusion on 09/14/2016.  She states that she has noticed significant enlargement in her aneurysmal fistula recently, as well as worsening pain.   PAST MEDICAL HISTORY:   Past Medical History:  Diagnosis Date  . Arthritis   . Cancer ([place])    Left Breast  . CHF (congestive heart failure) ([place])   . Chronic kidney disease    Tues, Thurs, Sat  . Diabetes mellitus without complication ([place])   . DVT (deep venous thrombosis) ([place])   . Dyspnea    with exertion  . Hyperlipidemia   . Hypertension   . Neuromuscular disorder ([place])   . Peripheral arterial disease ([place])   . Presence of permanent cardiac pacemaker    [employer]  . Stroke ([place])    light  . Thyroid disease      FAMILY HISTORY:   History reviewed. No pertinent family history.  SOCIAL HISTORY:   Social History   Tobacco Use  . Smoking status: Former Smoker    Years: 20.00    Types: Cigarettes    Last attempt to quit: 09/09/1986    Years since quitting: 31.6  . Smokeless tobacco: Never Used  Substance Use Topics  . Alcohol use: No     ALLERGIES:   Allergies  Allergen Reactions  . Penicillins Hives  . Oxycodone Nausea And Vomiting     CURRENT MEDICATIONS:   Current Outpatient Medications  Medication Sig Dispense Refill  . aspirin EC 81 MG tablet Take 81 mg by mouth.    . calcium carbonate (TUMS EX) 750 MG chewable tablet Chew 1 tablet by mouth 3 (three) times daily as needed for heartburn.    . cinacalcet (SENSIPAR) 30 MG tablet Take 30 mg by mouth daily.    Marland Kitchen docusate sodium (COLACE) 100 MG capsule Take 100 mg by  mouth once a week.     Marland Kitchen oxyCODONE-acetaminophen (PERCOCET/ROXICET) 5-325 MG tablet Take 1 tablet by mouth every 6 (six) hours as needed. 10 tablet 0  . pravastatin (PRAVACHOL) 20 MG tablet Take 20 mg by mouth daily.     . sucroferric oxyhydroxide (VELPHORO) 500 MG chewable tablet Chew 1,000 mg by mouth 3 (three) times daily with meals.    . warfarin (COUMADIN) 5 MG tablet Take 1 tablet (5 mg total) by mouth daily.     No current facility-administered medications for this visit.     REVIEW OF SYSTEMS:   [X]  denotes positive finding, [ ]  denotes negative finding Cardiac  Comments:  Chest pain or chest pressure: x   Shortness of breath upon exertion:    Short of breath when lying flat:    Irregular heart rhythm: x       Vascular    Pain in calf, thigh, or hip brought on by ambulation: x   Pain in feet at night that wakes you up from your sleep:     Blood clot in your veins:    Leg swelling:         Pulmonary    Oxygen at home:    Productive cough:     Wheezing:  Neurologic    Sudden weakness in arms or legs:     Sudden numbness in arms or legs:     Sudden onset of difficulty speaking or slurred speech:    Temporary loss of vision in one eye:     Problems with dizziness:         Gastrointestinal    Blood in stool:     Vomited blood:         Genitourinary    Burning when urinating:     Blood in urine:        Psychiatric    Major depression:         Hematologic    Bleeding problems:    Problems with blood clotting too easily:        Skin    Rashes or ulcers:        Constitutional    Fever or chills:      PHYSICAL EXAM:   Vitals:   05/14/18 1055 05/14/18 1056  BP: (!) 170/84 (!) 174/85  Pulse: 74   Resp: 18   SpO2: 100%   Height: 5\' 3"  (1.6 m)     GENERAL: The patient is a well-nourished female, in no acute distress. The vital signs are documented above. CARDIAC: There is a regular rate and rhythm.  VASCULAR: Aneurysmal left brachiocephalic  fistula with excellent thrill PULMONARY: Non-labored respirations MUSCULOSKELETAL: There are no major deformities or cyanosis. NEUROLOGIC: No focal weakness or paresthesias are detected. SKIN: There are no ulcers or rashes noted. PSYCHIATRIC: The patient has a normal affect.  STUDIES:   None  MEDICAL ISSUES:   Aneurysmal left brachiocephalic fistula: We discussed proceeding with plication of her aneurysms.  There is an excellent thrill within the fistula and so I will not plan on shooting a fistulogram prior to her operation.  She is going to need to stop her Eliquis prior to her operation.  I told her that I would not place a catheter at the time of her operation, however if her fistula is unable to be cannulated because of the incision, she would require a catheter later.  This is been scheduled for August 30    [name], MD [facility] [phone] Pager [phone]

## 2018-05-14 NOTE — H&P (View-Only) (Signed)
[facility]ist of [place]  Patient name: Stacy Hammond MRN: [id number] DOB: 1946/03/14 Sex: female   REASON FOR VISIT:    Aneurysmal fisatula  HISOTRY OF PRESENT ILLNESS:    [name] is a 72 y.o. female who is status post left brachiocephalic fistula creation.  She underwent a cephalic vein turndown for a subtotal occlusion on 09/14/2016.  She states that she has noticed significant enlargement in her aneurysmal fistula recently, as well as worsening pain.   PAST MEDICAL HISTORY:   Past Medical History:  Diagnosis Date  . Arthritis   . Cancer ([place])    Left Breast  . CHF (congestive heart failure) ([place])   . Chronic kidney disease    Tues, Thurs, Sat  . Diabetes mellitus without complication ([place])   . DVT (deep venous thrombosis) ([place])   . Dyspnea    with exertion  . Hyperlipidemia   . Hypertension   . Neuromuscular disorder ([place])   . Peripheral arterial disease ([place])   . Presence of permanent cardiac pacemaker    [employer]  . Stroke ([place])    light  . Thyroid disease      FAMILY HISTORY:   History reviewed. No pertinent family history.  SOCIAL HISTORY:   Social History   Tobacco Use  . Smoking status: Former Smoker    Years: 20.00    Types: Cigarettes    Last attempt to quit: 09/09/1986    Years since quitting: 31.6  . Smokeless tobacco: Never Used  Substance Use Topics  . Alcohol use: No     ALLERGIES:   Allergies  Allergen Reactions  . Penicillins Hives  . Oxycodone Nausea And Vomiting     CURRENT MEDICATIONS:   Current Outpatient Medications  Medication Sig Dispense Refill  . aspirin EC 81 MG tablet Take 81 mg by mouth.    . calcium carbonate (TUMS EX) 750 MG chewable tablet Chew 1 tablet by mouth 3 (three) times daily as needed for heartburn.    . cinacalcet (SENSIPAR) 30 MG tablet Take 30 mg by mouth daily.    Marland Kitchen docusate sodium (COLACE) 100 MG capsule Take 100 mg by  mouth once a week.     Marland Kitchen oxyCODONE-acetaminophen (PERCOCET/ROXICET) 5-325 MG tablet Take 1 tablet by mouth every 6 (six) hours as needed. 10 tablet 0  . pravastatin (PRAVACHOL) 20 MG tablet Take 20 mg by mouth daily.     . sucroferric oxyhydroxide (VELPHORO) 500 MG chewable tablet Chew 1,000 mg by mouth 3 (three) times daily with meals.    . warfarin (COUMADIN) 5 MG tablet Take 1 tablet (5 mg total) by mouth daily.     No current facility-administered medications for this visit.     REVIEW OF SYSTEMS:   [X]  denotes positive finding, [ ]  denotes negative finding Cardiac  Comments:  Chest pain or chest pressure: x   Shortness of breath upon exertion:    Short of breath when lying flat:    Irregular heart rhythm: x       Vascular    Pain in calf, thigh, or hip brought on by ambulation: x   Pain in feet at night that wakes you up from your sleep:     Blood clot in your veins:    Leg swelling:         Pulmonary    Oxygen at home:    Productive cough:     Wheezing:  Neurologic    Sudden weakness in arms or legs:     Sudden numbness in arms or legs:     Sudden onset of difficulty speaking or slurred speech:    Temporary loss of vision in one eye:     Problems with dizziness:         Gastrointestinal    Blood in stool:     Vomited blood:         Genitourinary    Burning when urinating:     Blood in urine:        Psychiatric    Major depression:         Hematologic    Bleeding problems:    Problems with blood clotting too easily:        Skin    Rashes or ulcers:        Constitutional    Fever or chills:      PHYSICAL EXAM:   Vitals:   05/14/18 1055 05/14/18 1056  BP: (!) 170/84 (!) 174/85  Pulse: 74   Resp: 18   SpO2: 100%   Height: 5\' 3"  (1.6 m)     GENERAL: The patient is a well-nourished female, in no acute distress. The vital signs are documented above. CARDIAC: There is a regular rate and rhythm.  VASCULAR: Aneurysmal left brachiocephalic  fistula with excellent thrill PULMONARY: Non-labored respirations MUSCULOSKELETAL: There are no major deformities or cyanosis. NEUROLOGIC: No focal weakness or paresthesias are detected. SKIN: There are no ulcers or rashes noted. PSYCHIATRIC: The patient has a normal affect.  STUDIES:   None  MEDICAL ISSUES:   Aneurysmal left brachiocephalic fistula: We discussed proceeding with plication of her aneurysms.  There is an excellent thrill within the fistula and so I will not plan on shooting a fistulogram prior to her operation.  She is going to need to stop her Eliquis prior to her operation.  I told her that I would not place a catheter at the time of her operation, however if her fistula is unable to be cannulated because of the incision, she would require a catheter later.  This is been scheduled for August 30    [name], MD [facility] of [facility] [phone] Pager [phone]

## 2018-05-16 ENCOUNTER — Encounter: Payer: Self-pay | Admitting: Nephrology

## 2018-05-31 NOTE — Progress Notes (Signed)
Attempted numerous times to contact pt, left messages with no return call. Emergency Contact number is not in service. Left detailed pre-op instructions on pt's voicemail.   Pt was instructed to stop Eliquis 48 hours by Dr. Stephens [place] office.

## 2018-05-31 NOTE — Anesthesia Preprocedure Evaluation (Addendum)
Anesthesia Evaluation  Patient identified by MRN, date of birth, ID band Patient awake    Reviewed: Allergy & Precautions, H&P , NPO status , Patient's Chart, lab work & pertinent test results  Airway Mallampati: II   Neck ROM: full    Dental   Pulmonary shortness of breath, former smoker,    breath sounds clear to auscultation       Cardiovascular hypertension, Pt. on medications and Pt. on home beta blockers + Peripheral Vascular Disease and +CHF  + pacemaker  Rhythm:regular Rate:Normal     Neuro/Psych CVA    GI/Hepatic negative GI ROS, Neg liver ROS,   Endo/Other  diabetes, Type 2  Renal/GU ESRF and DialysisRenal disease     Musculoskeletal  (+) Arthritis ,   Abdominal   Peds  Hematology negative hematology ROS (+)   Anesthesia Other Findings   Reproductive/Obstetrics                              Anesthesia Plan  ASA: III  Anesthesia Plan: General   Post-op Pain Management:    Induction: Intravenous  PONV Risk Score and Plan: Ondansetron and Treatment may vary due to age or medical condition  Airway Management Planned: LMA  Additional Equipment:   Intra-op Plan:   Post-operative Plan: Extubation in OR  Informed Consent: I have reviewed the patients History and Physical, chart, labs and discussed the procedure including the risks, benefits and alternatives for the proposed anesthesia with the patient or authorized representative who has indicated his/her understanding and acceptance.     Plan Discussed with: CRNA, Anesthesiologist and Surgeon  Anesthesia Plan Comments:        Anesthesia Quick Evaluation

## 2018-05-31 NOTE — Progress Notes (Signed)
Paged rep for [employer] to call. Dionne Milo, the rep on call returned my call. I gave her the patient's information, surgery start time and type of surgery pt is having. She states she will put the patient on their list for in the morning.

## 2018-06-01 ENCOUNTER — Encounter (HOSPITAL_COMMUNITY): Payer: Self-pay | Admitting: General Practice

## 2018-06-01 ENCOUNTER — Encounter (HOSPITAL_COMMUNITY)
Admission: RE | Disposition: A | Discharge status: Home / Self Care | Payer: Self-pay | Source: Ambulatory Visit | Attending: Surgery

## 2018-06-01 ENCOUNTER — Telehealth: Payer: Self-pay | Admitting: Surgery

## 2018-06-01 ENCOUNTER — Ambulatory Visit (HOSPITAL_COMMUNITY): Payer: Medicare HMO | Admitting: Anesthesiology

## 2018-06-01 ENCOUNTER — Ambulatory Visit (HOSPITAL_COMMUNITY)
Admission: RE | Admit: 2018-06-01 | Discharge: 2018-06-01 | Disposition: A | Discharge status: Home / Self Care | Payer: Medicare HMO | Source: Ambulatory Visit | Attending: Surgery | Admitting: Surgery

## 2018-06-01 ENCOUNTER — Other Ambulatory Visit: Payer: Self-pay

## 2018-06-01 DIAGNOSIS — Z7901 Long term (current) use of anticoagulants: Secondary | ICD-10-CM | POA: Insufficient documentation

## 2018-06-01 DIAGNOSIS — T82898A Other specified complication of vascular prosthetic devices, implants and grafts, initial encounter: Secondary | ICD-10-CM | POA: Insufficient documentation

## 2018-06-01 DIAGNOSIS — Y832 Surgical operation with anastomosis, bypass or graft as the cause of abnormal reaction of the patient, or of later complication, without mention of misadventure at the time of the procedure: Secondary | ICD-10-CM | POA: Diagnosis not present

## 2018-06-01 DIAGNOSIS — Z86718 Personal history of other venous thrombosis and embolism: Secondary | ICD-10-CM | POA: Insufficient documentation

## 2018-06-01 DIAGNOSIS — I509 Heart failure, unspecified: Secondary | ICD-10-CM | POA: Insufficient documentation

## 2018-06-01 DIAGNOSIS — M1990 Unspecified osteoarthritis, unspecified site: Secondary | ICD-10-CM | POA: Insufficient documentation

## 2018-06-01 DIAGNOSIS — Z880 Allergy status to penicillin: Secondary | ICD-10-CM | POA: Insufficient documentation

## 2018-06-01 DIAGNOSIS — E079 Disorder of thyroid, unspecified: Secondary | ICD-10-CM | POA: Insufficient documentation

## 2018-06-01 DIAGNOSIS — E1151 Type 2 diabetes mellitus with diabetic peripheral angiopathy without gangrene: Secondary | ICD-10-CM | POA: Insufficient documentation

## 2018-06-01 DIAGNOSIS — Z8673 Personal history of transient ischemic attack (TIA), and cerebral infarction without residual deficits: Secondary | ICD-10-CM | POA: Insufficient documentation

## 2018-06-01 DIAGNOSIS — N186 End stage renal disease: Secondary | ICD-10-CM | POA: Insufficient documentation

## 2018-06-01 DIAGNOSIS — Z950 Presence of cardiac pacemaker: Secondary | ICD-10-CM | POA: Insufficient documentation

## 2018-06-01 DIAGNOSIS — E785 Hyperlipidemia, unspecified: Secondary | ICD-10-CM | POA: Diagnosis not present

## 2018-06-01 DIAGNOSIS — Z7982 Long term (current) use of aspirin: Secondary | ICD-10-CM | POA: Insufficient documentation

## 2018-06-01 DIAGNOSIS — E1122 Type 2 diabetes mellitus with diabetic chronic kidney disease: Secondary | ICD-10-CM | POA: Insufficient documentation

## 2018-06-01 DIAGNOSIS — Z87891 Personal history of nicotine dependence: Secondary | ICD-10-CM | POA: Insufficient documentation

## 2018-06-01 DIAGNOSIS — I132 Hypertensive heart and chronic kidney disease with heart failure and with stage 5 chronic kidney disease, or end stage renal disease: Secondary | ICD-10-CM | POA: Diagnosis not present

## 2018-06-01 HISTORY — PX: FISTULA SUPERFICIALIZATION: SHX6341

## 2018-06-01 LAB — POCT I-STAT 4, (NA,K, GLUC, HGB,HCT)
GLUCOSE: 95 mg/dL (ref 70–99)
HEMATOCRIT: 39 % (ref 36.0–46.0)
HEMOGLOBIN: 13.3 g/dL (ref 12.0–15.0)
POTASSIUM: 3.9 mmol/L (ref 3.5–5.1)
Sodium: 141 mmol/L (ref 135–145)

## 2018-06-01 LAB — PROTIME-INR
INR: 0.98
Prothrombin Time: 12.9 seconds (ref 11.4–15.2)

## 2018-06-01 LAB — GLUCOSE, CAPILLARY
GLUCOSE-CAPILLARY: 68 mg/dL — AB (ref 70–99)
Glucose-Capillary: 118 mg/dL — ABNORMAL HIGH (ref 70–99)

## 2018-06-01 SURGERY — FISTULA SUPERFICIALIZATION
Anesthesia: General | Site: Arm Upper | Laterality: Left

## 2018-06-01 MED ORDER — METOPROLOL SUCCINATE ER 25 MG PO TB24
12.5000 mg | ORAL_TABLET | Freq: Once | ORAL | Status: AC
  Administered 2018-06-01: 12.5 mg via ORAL
  Filled 2018-06-01: qty 1

## 2018-06-01 MED ORDER — LIDOCAINE-EPINEPHRINE (PF) 1 %-1:200000 IJ SOLN
INTRAMUSCULAR | Status: AC
  Filled 2018-06-01: qty 30

## 2018-06-01 MED ORDER — ELIQUIS 5 MG PO TABS: 5.0000 mg | ORAL_TABLET | Freq: Two times a day (BID) | ORAL | 6 refills | Status: DC

## 2018-06-01 MED ORDER — ONDANSETRON HCL 4 MG/2ML IJ SOLN
INTRAMUSCULAR | Status: AC
  Filled 2018-06-01: qty 2

## 2018-06-01 MED ORDER — GLYCOPYRROLATE PF 0.2 MG/ML IJ SOSY
PREFILLED_SYRINGE | INTRAMUSCULAR | Status: AC
  Filled 2018-06-01: qty 1

## 2018-06-01 MED ORDER — TRAMADOL HCL 50 MG PO TABS: 50.0000 mg | ORAL_TABLET | Freq: Four times a day (QID) | ORAL | 0 refills | Status: DC | PRN

## 2018-06-01 MED ORDER — FENTANYL CITRATE (PF) 100 MCG/2ML IJ SOLN
INTRAMUSCULAR | Status: DC | PRN
  Administered 2018-06-01: 50 ug via INTRAVENOUS
  Administered 2018-06-01: 25 ug via INTRAVENOUS

## 2018-06-01 MED ORDER — SODIUM CHLORIDE 0.9 % IV SOLN
INTRAVENOUS | Status: DC | PRN
  Administered 2018-06-01: 500 mL

## 2018-06-01 MED ORDER — HEPARIN SODIUM (PORCINE) 1000 UNIT/ML IJ SOLN
INTRAMUSCULAR | Status: AC
  Filled 2018-06-01: qty 1

## 2018-06-01 MED ORDER — ONDANSETRON HCL 4 MG/2ML IJ SOLN: 4.0000 mg | Freq: Four times a day (QID) | INTRAMUSCULAR | Status: DC | PRN

## 2018-06-01 MED ORDER — DEXAMETHASONE SODIUM PHOSPHATE 10 MG/ML IJ SOLN
INTRAMUSCULAR | Status: AC
  Filled 2018-06-01: qty 1

## 2018-06-01 MED ORDER — ONDANSETRON HCL 4 MG/2ML IJ SOLN
INTRAMUSCULAR | Status: DC | PRN
  Administered 2018-06-01: 4 mg via INTRAVENOUS

## 2018-06-01 MED ORDER — GLYCOPYRROLATE PF 0.2 MG/ML IJ SOSY
PREFILLED_SYRINGE | INTRAMUSCULAR | Status: DC | PRN
  Administered 2018-06-01: .1 mg via INTRAVENOUS

## 2018-06-01 MED ORDER — PROTAMINE SULFATE 10 MG/ML IV SOLN
INTRAVENOUS | Status: DC | PRN
  Administered 2018-06-01: 10 mg via INTRAVENOUS
  Administered 2018-06-01: 30 mg via INTRAVENOUS

## 2018-06-01 MED ORDER — SODIUM CHLORIDE 0.9 % IV SOLN
INTRAVENOUS | Status: DC
  Administered 2018-06-01: 07:00:00 via INTRAVENOUS

## 2018-06-01 MED ORDER — DEXAMETHASONE SODIUM PHOSPHATE 10 MG/ML IJ SOLN
INTRAMUSCULAR | Status: DC | PRN
  Administered 2018-06-01: 4 mg via INTRAVENOUS

## 2018-06-01 MED ORDER — LIDOCAINE 2% (20 MG/ML) 5 ML SYRINGE
INTRAMUSCULAR | Status: AC
  Filled 2018-06-01: qty 5

## 2018-06-01 MED ORDER — VANCOMYCIN HCL IN DEXTROSE 1-5 GM/200ML-% IV SOLN
1000.0000 mg | INTRAVENOUS | Status: AC
  Administered 2018-06-01: 1000 mg via INTRAVENOUS
  Filled 2018-06-01: qty 200

## 2018-06-01 MED ORDER — FENTANYL CITRATE (PF) 250 MCG/5ML IJ SOLN
INTRAMUSCULAR | Status: AC
  Filled 2018-06-01: qty 5

## 2018-06-01 MED ORDER — FENTANYL CITRATE (PF) 100 MCG/2ML IJ SOLN
25.0000 ug | INTRAMUSCULAR | Status: DC | PRN
  Administered 2018-06-01 (×2): 50 ug via INTRAVENOUS

## 2018-06-01 MED ORDER — SODIUM CHLORIDE 0.9 % IV SOLN
INTRAVENOUS | Status: DC | PRN
  Administered 2018-06-01: 10 ug/min via INTRAVENOUS

## 2018-06-01 MED ORDER — 0.9 % SODIUM CHLORIDE (POUR BTL) OPTIME
TOPICAL | Status: DC | PRN
  Administered 2018-06-01: 1000 mL

## 2018-06-01 MED ORDER — HEPARIN SODIUM (PORCINE) 1000 UNIT/ML IJ SOLN
INTRAMUSCULAR | Status: DC | PRN
  Administered 2018-06-01: 3000 [IU] via INTRAVENOUS

## 2018-06-01 MED ORDER — LIDOCAINE HCL (PF) 1 % IJ SOLN
INTRAMUSCULAR | Status: AC
  Filled 2018-06-01: qty 30

## 2018-06-01 MED ORDER — MIDAZOLAM HCL 2 MG/2ML IJ SOLN
INTRAMUSCULAR | Status: AC
  Filled 2018-06-01: qty 2

## 2018-06-01 MED ORDER — PROTAMINE SULFATE 10 MG/ML IV SOLN
INTRAVENOUS | Status: AC
  Filled 2018-06-01: qty 5

## 2018-06-01 MED ORDER — SODIUM CHLORIDE 0.9 % IV SOLN
INTRAVENOUS | Status: AC
  Filled 2018-06-01: qty 1.2

## 2018-06-01 MED ORDER — LIDOCAINE 2% (20 MG/ML) 5 ML SYRINGE
INTRAMUSCULAR | Status: DC | PRN
  Administered 2018-06-01: 60 mg via INTRAVENOUS

## 2018-06-01 MED ORDER — PROPOFOL 10 MG/ML IV BOLUS
INTRAVENOUS | Status: DC | PRN
  Administered 2018-06-01: 100 mg via INTRAVENOUS
  Administered 2018-06-01: 20 mg via INTRAVENOUS

## 2018-06-01 MED ORDER — HEMOSTATIC AGENTS (NO CHARGE) OPTIME
TOPICAL | Status: DC | PRN
  Administered 2018-06-01: 1 via TOPICAL

## 2018-06-01 MED ORDER — FENTANYL CITRATE (PF) 100 MCG/2ML IJ SOLN
INTRAMUSCULAR | Status: AC
  Administered 2018-06-01: 50 ug via INTRAVENOUS
  Filled 2018-06-01: qty 2

## 2018-06-01 SURGICAL SUPPLY — 39 items
ARMBAND PINK RESTRICT EXTREMIT (MISCELLANEOUS) ×3 IMPLANT
BANDAGE ACE 4X5 VEL STRL LF (GAUZE/BANDAGES/DRESSINGS) ×6 IMPLANT
CANISTER SUCT 3000ML PPV (MISCELLANEOUS) ×3 IMPLANT
CLIP VESOCCLUDE MED 6/CT (CLIP) ×3 IMPLANT
CLIP VESOCCLUDE SM WIDE 6/CT (CLIP) ×3 IMPLANT
COVER PROBE W GEL 5X96 (DRAPES) ×3 IMPLANT
DERMABOND ADVANCED (GAUZE/BANDAGES/DRESSINGS) ×2
DERMABOND ADVANCED .7 DNX12 (GAUZE/BANDAGES/DRESSINGS) ×1 IMPLANT
ELECT REM PT RETURN 9FT ADLT (ELECTROSURGICAL) ×3
ELECTRODE REM PT RTRN 9FT ADLT (ELECTROSURGICAL) ×1 IMPLANT
GLOVE BIO SURGEON STRL SZ7.5 (GLOVE) ×3 IMPLANT
GLOVE BIOGEL PI IND STRL 7.0 (GLOVE) ×1 IMPLANT
GLOVE BIOGEL PI IND STRL 7.5 (GLOVE) ×2 IMPLANT
GLOVE BIOGEL PI INDICATOR 7.0 (GLOVE) ×2
GLOVE BIOGEL PI INDICATOR 7.5 (GLOVE) ×4
GLOVE ECLIPSE 7.0 STRL STRAW (GLOVE) ×3 IMPLANT
GLOVE SURG SS PI 6.0 STRL IVOR (GLOVE) ×3 IMPLANT
GLOVE SURG SS PI 7.5 STRL IVOR (GLOVE) ×3 IMPLANT
GOWN STRL REUS W/ TWL LRG LVL3 (GOWN DISPOSABLE) ×2 IMPLANT
GOWN STRL REUS W/ TWL XL LVL3 (GOWN DISPOSABLE) ×1 IMPLANT
GOWN STRL REUS W/TWL LRG LVL3 (GOWN DISPOSABLE) ×4
GOWN STRL REUS W/TWL XL LVL3 (GOWN DISPOSABLE) ×2
HEMOSTAT SNOW SURGICEL 2X4 (HEMOSTASIS) ×3 IMPLANT
KIT BASIN OR (CUSTOM PROCEDURE TRAY) ×3 IMPLANT
KIT TURNOVER KIT B (KITS) ×3 IMPLANT
NS IRRIG 1000ML POUR BTL (IV SOLUTION) ×3 IMPLANT
PACK CV ACCESS (CUSTOM PROCEDURE TRAY) ×3 IMPLANT
PAD ARMBOARD 7.5X6 YLW CONV (MISCELLANEOUS) ×6 IMPLANT
SUT MNCRL AB 4-0 PS2 18 (SUTURE) ×3 IMPLANT
SUT PROLENE 5 0 C 1 24 (SUTURE) ×6 IMPLANT
SUT PROLENE 6 0 CC (SUTURE) ×12 IMPLANT
SUT VIC AB 2-0 CT1 27 (SUTURE) ×2
SUT VIC AB 2-0 CT1 TAPERPNT 27 (SUTURE) ×1 IMPLANT
SUT VIC AB 3-0 SH 27 (SUTURE) ×2
SUT VIC AB 3-0 SH 27X BRD (SUTURE) ×1 IMPLANT
SUT VICRYL 4-0 PS2 18IN ABS (SUTURE) IMPLANT
TOWEL GREEN STERILE (TOWEL DISPOSABLE) ×3 IMPLANT
UNDERPAD 30X30 (UNDERPADS AND DIAPERS) ×3 IMPLANT
WATER STERILE IRR 1000ML POUR (IV SOLUTION) ×3 IMPLANT

## 2018-06-01 NOTE — Op Note (Signed)
    Patient name: Stacy Hammond MRN: [id number] DOB: 1946-01-02 Sex: female  06/01/2018 Pre-operative Diagnosis: Aneurysmal left brachiocephalic fistula Post-operative diagnosis:  Same Surgeon:  [name] Assistants: Laurence Slate Procedure:   Plication of left brachiocephalic fistula aneurysm x2 Anesthesia: General Blood Loss: 100 cc Specimens: None  Findings: 2 large aneurysms were identified.  They were each plicated, resecting approximately one half of each aneurysm  Indications: The patient has noticed a significant increase in the size of 2 aneurysms within her brachiocephalic fistula.  We discussed proceeding with repair.  I discussed that I would plicate her aneurysms and hopefully, she can continue to get dialysis with access avoiding the incision.  She understands that if this is not possible she would require a catheter for 1 month, but that I would not place a catheter today.  Procedure:  The patient was identified in the holding area and taken to [place] [address]  The patient was then placed supine on the table. general anesthesia was administered.  The patient was prepped and draped in the usual sterile fashion.  A time out was called and antibiotics were administered.  An elliptical incision was made over top of the 2 aneurysmal areas of her left brachiocephalic fistula.  I then used cautery and sharp dissection to circumferentially dissect out the aneurysms as well as the normal caliber cephalic vein proximal and distal to the aneurysms.  Once this was done, I occluded her fistula.  I then used a #11 blade to open the aneurysm sac.  The venotomy was extended with Metzenbaum scissors.  I then resected approximately 50% of the aneurysmal tissue.  I then closed the fistula primarily using 2 layers of a running 5-0 Prolene.  Once this was completed the clamps were released.  There was good hemostasis.  There was an excellent thrill within the fistula.  Hemostasis was then achieved.  I  then reapproximated the subcutaneous tissue with 3-0 Vicryl and closed the skin with 4-0 Vicryl and Dermabond.  An Ace wrap was used as a compression dressing.  There were no immediate complications   Disposition: To PACU stable   V. [name], M.D. [facility] [place] Office: [phone] Pager:  [phone]

## 2018-06-01 NOTE — Anesthesia Postprocedure Evaluation (Signed)
Anesthesia Post Note  Patient: Stacy Hammond  Procedure(s) Performed: FISTULA PLICATION LEFT ARM (Left Arm Upper)     Patient location during evaluation: PACU Anesthesia Type: General Level of consciousness: awake and alert Pain management: pain level controlled Vital Signs Assessment: post-procedure vital signs reviewed and stable Respiratory status: spontaneous breathing, nonlabored ventilation, respiratory function stable and patient connected to nasal cannula oxygen Cardiovascular status: blood pressure returned to baseline and stable Postop Assessment: no apparent nausea or vomiting Anesthetic complications: no    Last Vitals:  Vitals:   06/01/18 1005 06/01/18 1006  BP: (!) 153/87   Pulse: 91 88  Resp:    Temp:    SpO2: 90% 95%    Last Pain:  Vitals:   06/01/18 0941  TempSrc:   PainSc: [place]

## 2018-06-01 NOTE — Discharge Instructions (Signed)
May stick above new incision for HD.  Leave ace wrap on until HD tomorrow.  Take down for HD.

## 2018-06-01 NOTE — Interval H&P Note (Signed)
History and Physical Interval Note:  06/01/2018 7:25 AM  Stacy Hammond  has presented today for surgery, with the diagnosis of left arm fistula aneurysm  The various methods of treatment have been discussed with the patient and family. After consideration of risks, benefits and other options for treatment, the patient has consented to  Procedure(s): FISTULA PLICATION LEFT ARM (Left) as a surgical intervention .  The patient's history has been reviewed, patient examined, no change in status, stable for surgery.  I have reviewed the patient's chart and labs.  Questions were answered to the patient's satisfaction.     [name]

## 2018-06-01 NOTE — Anesthesia Procedure Notes (Signed)
Procedure Name: LMA Insertion Date/Time: 06/01/2018 7:39 AM Performed by: Myna Bright, CRNA Pre-anesthesia Checklist: Patient identified, Suction available, Emergency Drugs available, Patient being monitored and Timeout performed Patient Re-evaluated:Patient Re-evaluated prior to induction Oxygen Delivery Method: Circle system utilized Preoxygenation: Pre-oxygenation with 100% oxygen Induction Type: IV induction Ventilation: Mask ventilation without difficulty LMA: LMA inserted LMA Size: 4.0 Number of attempts: 1 Placement Confirmation: positive ETCO2,  CO2 detector and breath sounds checked- equal and bilateral Tube secured with: Tape Dental Injury: Teeth and Oropharynx as per pre-operative assessment  Comments: LMA inserted by Violet Baldy

## 2018-06-01 NOTE — Progress Notes (Signed)
Spoke to [employer], [employer], about pacemaker. Stacy Hammond states nothing needs to be done to pacemaker post-op.

## 2018-06-01 NOTE — Telephone Encounter (Signed)
sch appt lvm mld ltr 06/18/18 3pm wound check

## 2018-06-01 NOTE — Transfer of Care (Signed)
Immediate Anesthesia Transfer of Care Note  Patient: Stacy Hammond  Procedure(s) Performed: FISTULA PLICATION LEFT ARM (Left Arm Upper)  Patient Location: PACU  Anesthesia Type:MAC  Level of Consciousness: awake, alert , oriented and patient cooperative  Airway & Oxygen Therapy: Patient Spontanous Breathing and Patient connected to face mask oxygen  Post-op Assessment: Report given to RN, Post -op Vital signs reviewed and stable and Patient moving all extremities  Post vital signs: Reviewed and stable  Last Vitals:  Vitals Value Taken Time  BP 132/81 06/01/2018  9:20 AM  Temp    Pulse 97 06/01/2018  9:21 AM  Resp 14 06/01/2018  9:21 AM  SpO2 95 % 06/01/2018  9:21 AM  Vitals shown include unvalidated device data.  Last Pain:  Vitals:   06/01/18 0621  TempSrc:   PainSc: 0-No pain         Complications: No apparent anesthesia complications

## 2018-06-02 ENCOUNTER — Encounter (HOSPITAL_COMMUNITY): Payer: Self-pay | Admitting: Surgery

## 2018-06-18 ENCOUNTER — Encounter: Payer: Self-pay | Admitting: Surgery

## 2018-06-18 ENCOUNTER — Ambulatory Visit: Payer: Medicare HMO

## 2018-06-18 NOTE — Progress Notes (Deleted)
  POST OPERATIVE OFFICE NOTE    CC:  F/u for surgery  HPI:  This is a 72 y.o. female who is s/p Plication of left brachiocephalic fistula aneurysm x2.    Allergies  Allergen Reactions  . Penicillins Hives    Has patient had a PCN reaction causing immediate rash, facial/tongue/throat swelling, SOB or lightheadedness with hypotension: No Has patient had a PCN reaction causing severe rash involving mucus membranes or skin necrosis: No Has patient had a PCN reaction that required hospitalization: No Has patient had a PCN reaction occurring within the last 10 years: Yes If all of the above answers are "NO", then may proceed with Cephalosporin use.   Marland Kitchen Oxycodone Nausea And Vomiting    Current Outpatient Medications  Medication Sig Dispense Refill  . calcium carbonate (TUMS EX) 750 MG chewable tablet Chew 1 tablet by mouth daily as needed for heartburn.     . cinacalcet (SENSIPAR) 30 MG tablet Take 30 mg by mouth 2 (two) times daily with a meal.     . docusate sodium (COLACE) 100 MG capsule Take 100 mg by mouth daily as needed for moderate constipation.     Marland Kitchen ELIQUIS 5 MG TABS tablet Take 1 tablet (5 mg total) by mouth 2 (two) times daily. 60 tablet 6  . metoprolol succinate (TOPROL-XL) 25 MG 24 hr tablet Take 12.5 mg by mouth daily.    . midodrine (PROAMATINE) 2.5 MG tablet Take 2.5 mg by mouth See admin instructions. Take 2.5mg  by twice daily on Tuesday, Thursday and Saturday  2  . pravastatin (PRAVACHOL) 20 MG tablet Take 20 mg by mouth daily.     . sucroferric oxyhydroxide (VELPHORO) 500 MG chewable tablet Chew 500 mg by mouth 3 (three) times daily with meals.     . traMADol (ULTRAM) 50 MG tablet Take 1 tablet (50 mg total) by mouth every 6 (six) hours as needed. 20 tablet 0  . traMADol (ULTRAM) 50 MG tablet Take 1 tablet (50 mg total) by mouth every 6 (six) hours as needed. 20 tablet 0   No current facility-administered medications for this visit.      ROS:  See HPI  Physical  Exam:  There were no vitals filed for this visit.  Incision:  *** Extremities:  *** Neuro: *** Abdomen:  ***  Assessment/Plan:  This is a 72 y.o. female who is s/p: ***  -***   Leontine Locket, PA-C [facility] [phone]  Clinic MD:  ***

## 2018-06-22 ENCOUNTER — Ambulatory Visit (INDEPENDENT_AMBULATORY_CARE_PROVIDER_SITE_OTHER): Payer: Self-pay | Admitting: Physician Assistant

## 2018-06-22 ENCOUNTER — Other Ambulatory Visit: Payer: Self-pay

## 2018-06-22 VITALS — BP 140/79 | HR 107 | Temp 97.8°F | Resp 16 | Ht 64.0 in | Wt 180.0 lb

## 2018-06-22 DIAGNOSIS — N186 End stage renal disease: Secondary | ICD-10-CM

## 2018-06-22 DIAGNOSIS — Z992 Dependence on renal dialysis: Secondary | ICD-10-CM

## 2018-06-22 NOTE — Progress Notes (Signed)
POST OPERATIVE OFFICE NOTE    CC:  F/u for surgery  HPI:  This is a 72 y.o. female who is s/p plication of left BC AVF aneurysm x 2 on 06/01/18 by Dr. Trula Slade. She returns today for follow up.  She states that she has done well since surgery.  She states they are able to access her fistula proximally but are nervous to stick it distally.  She says she gets some numbness in both hands after dialysis but other than that, no pain/numbness.    Allergies  Allergen Reactions  . Penicillins Hives    Has patient had a PCN reaction causing immediate rash, facial/tongue/throat swelling, SOB or lightheadedness with hypotension: No Has patient had a PCN reaction causing severe rash involving mucus membranes or skin necrosis: No Has patient had a PCN reaction that required hospitalization: No Has patient had a PCN reaction occurring within the last 10 years: Yes If all of the above answers are "NO", then may proceed with Cephalosporin use.   Marland Kitchen Oxycodone Nausea And Vomiting    Current Outpatient Medications  Medication Sig Dispense Refill  . calcium carbonate (TUMS EX) 750 MG chewable tablet Chew 1 tablet by mouth daily as needed for heartburn.     . cinacalcet (SENSIPAR) 30 MG tablet Take 30 mg by mouth 2 (two) times daily with a meal.     . docusate sodium (COLACE) 100 MG capsule Take 100 mg by mouth daily as needed for moderate constipation.     Marland Kitchen ELIQUIS 5 MG TABS tablet Take 1 tablet (5 mg total) by mouth 2 (two) times daily. 60 tablet 6  . metoprolol succinate (TOPROL-XL) 25 MG 24 hr tablet Take 12.5 mg by mouth daily.    . midodrine (PROAMATINE) 2.5 MG tablet Take 2.5 mg by mouth See admin instructions. Take 2.5mg  by twice daily on Tuesday, Thursday and Saturday  2  . pravastatin (PRAVACHOL) 20 MG tablet Take 20 mg by mouth daily.     . sucroferric oxyhydroxide (VELPHORO) 500 MG chewable tablet Chew 500 mg by mouth 3 (three) times daily with meals.     . traMADol (ULTRAM) 50 MG tablet Take 1  tablet (50 mg total) by mouth every 6 (six) hours as needed. 20 tablet 0  . traMADol (ULTRAM) 50 MG tablet Take 1 tablet (50 mg total) by mouth every 6 (six) hours as needed. 20 tablet 0   No current facility-administered medications for this visit.      ROS:  See HPI  Physical Exam:  Vitals:   06/22/18 1551 06/22/18 1553  BP: (!) 147/90 140/79  Pulse: (!) 107 (!) 107  Resp: 16   Temp: 97.8 F (36.6 C)   SpO2: 97%    Vitals:   06/22/18 1551  Weight: 180 lb (81.6 kg)  Height: 5\' 4"  (1.626 m)   Body mass index is 30.9 kg/m.   Incision:  Healed nicely Extremities:  +palpable left radial pulse; this fistula is easily palpable with an excellent thrill throughout.  Motor and sensation are int tact left hand.   Assessment/Plan:  This is a 72 y.o. female who is s/p: plication of left BC AVF aneurysm x 2 on 06/01/18 by Dr. Trula Slade.    -pt doing well since surgery.  She has an excellent thrill throughout the fistula.  Incision is healing nicely.   -may stick the revised portion of the fistula starting July 10, 2018.  Until then, continue sticking proximal and distal to incision.  -she  will f/u with [facility] as needed.    Leontine Locket, PA-C [facility] [phone]  Clinic MD:  Donzetta Matters

## 2020-03-16 ENCOUNTER — Other Ambulatory Visit: Payer: Self-pay | Admitting: *Deleted

## 2020-03-16 DIAGNOSIS — N186 End stage renal disease: Secondary | ICD-10-CM

## 2020-03-18 ENCOUNTER — Ambulatory Visit (HOSPITAL_COMMUNITY)
Admission: RE | Admit: 2020-03-18 | Discharge: 2020-03-18 | Disposition: A | Discharge status: Home / Self Care | Payer: Medicare HMO | Source: Ambulatory Visit | Attending: Vascular Surgery | Admitting: Vascular Surgery

## 2020-03-18 ENCOUNTER — Encounter: Payer: Self-pay | Admitting: Vascular Surgery

## 2020-03-18 ENCOUNTER — Other Ambulatory Visit: Payer: Self-pay

## 2020-03-18 ENCOUNTER — Ambulatory Visit (INDEPENDENT_AMBULATORY_CARE_PROVIDER_SITE_OTHER): Payer: Medicare HMO | Admitting: Vascular Surgery

## 2020-03-18 ENCOUNTER — Ambulatory Visit (INDEPENDENT_AMBULATORY_CARE_PROVIDER_SITE_OTHER)
Admission: RE | Admit: 2020-03-18 | Discharge: 2020-03-18 | Disposition: A | Discharge status: Home / Self Care | Payer: Medicare HMO | Source: Ambulatory Visit | Attending: Vascular Surgery | Admitting: Vascular Surgery

## 2020-03-18 VITALS — BP 149/85 | HR 74 | Temp 97.7°F | Resp 20 | Ht 64.0 in | Wt 193.2 lb

## 2020-03-18 DIAGNOSIS — Z992 Dependence on renal dialysis: Secondary | ICD-10-CM

## 2020-03-18 DIAGNOSIS — N186 End stage renal disease: Secondary | ICD-10-CM

## 2020-03-18 NOTE — H&P (View-Only) (Signed)
REASON FOR CONSULT:    To evaluate for hemodialysis access.  The consult was requested by Juanell Fairly, NP.  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  Based on the patient's most recent intervention by [facility] it was felt that this could not be revised any further.  For this reason we were asked to place new access.  Based on her vein map it looks like she could potentially be a candidate for basilic vein transposition on the right however she has a pacemaker on the right side and therefore I would favor continuing to use the left arm and avoiding the right arm given the risk of pacemaker infection or venous outflow obstruction resulting in significant arm swelling.  Therefore I would recommend ligating her current fistula, placing a new graft in the left arm, and placing a tunneled dialysis catheter until the graft can be used.  We have discussed the indications for the procedure and the potential complications and she is agreeable to proceed.  I explained that I can only do the procedures on Monday typically and I do not have any spots until July.  For this reason I will see if one of my partners can do it on a Monday Wednesday or Friday.  Deitra Mayo, MD Office: [phone]   HPI:   Stacy Hammond is a pleasant 74 y.o. female, who is referred for new access.  I have reviewed the records from the referring office.  The patient underwent a recent intervention by [facility] on January 22, 2020.  The patient had angioplasty of a significant stenosis in the inflow cephalic vein.  There was also an outflow cephalic vein stenosis that was angioplastied.  The central veins were patent.  It was felt that if the fistula failed they would need new access and for this reason the patient is referred for further evaluation.  The comments were that the access was very diseased and really no further intervention indicated.  She has  a pacemaker on the right side.  She does have a history of congestive heart failure and is followed in [place].  She dialyzes on Tuesdays Thursdays and Saturdays.  She is not had any recent uremic symptoms.  Specifically, she denies nausea, vomiting, fatigue, anorexia, or palpitations.  Past Medical History:  Diagnosis Date  . Arthritis   . Cancer ([place])    Left Breast  . CHF (congestive heart failure) ([place])   . Chronic kidney disease    Tues, Thurs, Sat  . Diabetes mellitus without complication ([place])   . DVT (deep venous thrombosis) ([place])   . Dyspnea    with exertion  . Hyperlipidemia   . Hypertension   . Neuromuscular disorder ([place])   . Peripheral arterial disease ([place])   . Presence of permanent cardiac pacemaker    [employer]  . 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 Thyroid disease     History reviewed. No pertinent family history.  SOCIAL HISTORY: Social History   Socioeconomic History  . Marital status: Single    Spouse name: Not on file  . Number of children: Not on file  . Years of education: Not on file  . Highest education level: Not on file  Occupational History  . Not on file  Tobacco Use  . Smoking status: Former Smoker    Years: 20.00    Types: Cigarettes    Quit date: 09/09/1986  Years since quitting: 33.5  . Smokeless tobacco: Never Used  Vaping Use  . Vaping Use: Never used  Substance and Sexual Activity  . Alcohol use: No  . Drug use: No  . Sexual activity: Not on file  Other Topics Concern  . Not on file  Social History Narrative  . Not on file   Social Determinants of Health   Financial Resource Strain:   . Difficulty of Paying Living Expenses:   Food Insecurity:   . Worried About Charity fundraiser in the Last Year:   . Arboriculturist in the Last Year:   Transportation Needs:   . Film/video editor (Medical):   Marland Kitchen Lack of Transportation (Non-Medical):   Physical Activity:   . Days of Exercise per Week:   . Minutes of  Exercise per Session:   Stress:   . Feeling of Stress :   Social Connections:   . Frequency of Communication with Friends and Family:   . Frequency of Social Gatherings with Friends and Family:   . Attends Religious Services:   . Active Member of Clubs or Organizations:   . Attends Archivist Meetings:   Marland Kitchen Marital Status:   Intimate Partner Violence:   . Fear of Current or Ex-Partner:   . Emotionally Abused:   Marland Kitchen Physically Abused:   . Sexually Abused:     Allergies  Allergen Reactions  . Penicillins Hives    Has patient had a PCN reaction causing immediate rash, facial/tongue/throat swelling, SOB or lightheadedness with hypotension: No Has patient had a PCN reaction causing severe rash involving mucus membranes or skin necrosis: No Has patient had a PCN reaction that required hospitalization: No Has patient had a PCN reaction occurring within the last 10 years: Yes If all of the above answers are "NO", then may proceed with Cephalosporin use.   Marland Kitchen Oxycodone Nausea And Vomiting    Current Outpatient Medications  Medication Sig Dispense Refill  . calcium carbonate (TUMS EX) 750 MG chewable tablet Chew 1 tablet by mouth daily as needed for heartburn.     . cinacalcet (SENSIPAR) 30 MG tablet Take 30 mg by mouth 2 (two) times daily with a meal.     . docusate sodium (COLACE) 100 MG capsule Take 100 mg by mouth daily as needed for moderate constipation.     Marland Kitchen ELIQUIS 5 MG TABS tablet Take 1 tablet (5 mg total) by mouth 2 (two) times daily. 60 tablet 6  . heparin 1000 unit/mL SOLN injection Heparin Sodium (Porcine) 1,000 Units/mL Systemic    . iron sucrose in sodium chloride 0.9 % 100 mL Iron Sucrose (Venofer)    . lidocaine-prilocaine (EMLA) cream APPLY SMALL AMOUNT TO ACCESS SITE (AVF) 1 TO 2 HOURS BEFORE DIALYSIS. COVER WITH OCCLUSIVE DRESSING (SARAN WRAP)  3  . midodrine (PROAMATINE) 2.5 MG tablet Take 2.5 mg by mouth See admin instructions. Take 2.5mg  by twice daily on  Tuesday, Thursday and Saturday  2  . pravastatin (PRAVACHOL) 20 MG tablet Take 20 mg by mouth daily.     . sucroferric oxyhydroxide (VELPHORO) 500 MG chewable tablet Chew 500 mg by mouth 3 (three) times daily with meals.     Marland Kitchen VITAMIN D, CHOLECALCIFEROL, PO Take by mouth.    . metoprolol succinate (TOPROL-XL) 25 MG 24 hr tablet Take 12.5 mg by mouth daily.    . traMADol (ULTRAM) 50 MG tablet Take 1 tablet (50 mg total) by mouth every 6 (six) hours  as needed. 20 tablet 0  . traMADol (ULTRAM) 50 MG tablet Take 1 tablet (50 mg total) by mouth every 6 (six) hours as needed. (Patient not taking: Reported on 03/18/2020) 20 tablet 0   No current facility-administered medications for this visit.    REVIEW OF SYSTEMS:  [X]  denotes positive finding, [ ]  denotes negative finding Cardiac  Comments:  Chest pain or chest pressure:    Shortness of breath upon exertion:    Short of breath when lying flat:    Irregular heart rhythm:        V[facility]    Pain in calf, thigh, or hip brought on by ambulation:    Pain in feet at night that wakes you up from your sleep:     Blood clot in your veins:    Leg swelling:         Pulmonary    Oxygen at home:    Productive cough:     Wheezing:         Neurologic    Sudden weakness in arms or legs:     Sudden numbness in arms or legs:     Sudden onset of difficulty speaking or slurred speech:    Temporary loss of vision in one eye:     Problems with dizziness:         Gastrointestinal    Blood in stool:     Vomited blood:         Genitourinary    Burning when urinating:     Blood in urine:        Psychiatric    Major depression:         Hematologic    Bleeding problems:    Problems with blood clotting too easily:        Skin    Rashes or ulcers:        Constitutional    Fever or chills:     PHYSICAL EXAM:   Vitals:   03/18/20 1050  BP: (!) 149/85  Pulse: 74  Resp: 20  Temp: 97.7 F (36.5 C)  TempSrc: Temporal  SpO2: 96%  Weight:  193 lb 3.2 oz (87.6 kg)  Height: 5\' 4"  (1.626 m)    GENERAL: The patient is a well-nourished female, in no acute distress. The vital signs are documented above. CARDIAC: There is a regular rate and rhythm.  VASCULAR: I do not detect carotid bruits. On the right side she has a palpable radial pulse. On the left side she has a weaker radial pulse but the signal is biphasic.  She has a normal brachial pulse on the left. PULMONARY: There is good air exchange bilaterally without wheezing or rales. ABDOMEN: Soft and non-tender with normal pitched bowel sounds.  MUSCULOSKELETAL: There are no major deformities or cyanosis. NEUROLOGIC: No focal weakness or paresthesias are detected. SKIN: There are no ulcers or rashes noted. PSYCHIATRIC: The patient has a normal affect.  DATA:    UPPER EXTREMITY ARTERIAL DUPLEX: I have independently interpreted her upper extremity arterial duplex scan.  On the right side there is a biphasic radial and ulnar waveform.  The brachial artery measures 0.44 cm in diameter.  Of note the ulnar artery is small proximally.  On the left side there is a monophasic signal noted at the brachial level.  There is a biphasic radial signal.  On the left side the ulnar artery is noted to be very small.  UPPER EXTREMITY VEIN MAP: I have independently interpreted her  upper extremity vein map.  On the right side the forearm and upper arm cephalic vein are small.  The basilic vein is marginal in size.  On the left side the basilic vein is marginal in size.

## 2020-03-18 NOTE — Progress Notes (Signed)
REASON FOR CONSULT:    To evaluate for hemodialysis access.  The consult was requested by Juanell Fairly, NP.  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  Based on the patient's most recent intervention by [facility] it was felt that this could not be revised any further.  For this reason we were asked to place new access.  Based on her vein map it looks like she could potentially be a candidate for basilic vein transposition on the right however she has a pacemaker on the right side and therefore I would favor continuing to use the left arm and avoiding the right arm given the risk of pacemaker infection or venous outflow obstruction resulting in significant arm swelling.  Therefore I would recommend ligating her current fistula, placing a new graft in the left arm, and placing a tunneled dialysis catheter until the graft can be used.  We have discussed the indications for the procedure and the potential complications and she is agreeable to proceed.  I explained that I can only do the procedures on Monday typically and I do not have any spots until July.  For this reason I will see if one of my partners can do it on a Monday Wednesday or Friday.  Deitra Mayo, MD Office: [phone]   HPI:   Stacy Hammond is a pleasant 74 y.o. female, who is referred for new access.  I have reviewed the records from the referring office.  The patient underwent a recent intervention by [facility] on January 22, 2020.  The patient had angioplasty of a significant stenosis in the inflow cephalic vein.  There was also an outflow cephalic vein stenosis that was angioplastied.  The central veins were patent.  It was felt that if the fistula failed they would need new access and for this reason the patient is referred for further evaluation.  The comments were that the access was very diseased and really no further intervention indicated.  She has  a pacemaker on the right side.  She does have a history of congestive heart failure and is followed in [place].  She dialyzes on Tuesdays Thursdays and Saturdays.  She is not had any recent uremic symptoms.  Specifically, she denies nausea, vomiting, fatigue, anorexia, or palpitations.  Past Medical History:  Diagnosis Date  . Arthritis   . 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 CHF (congestive heart failure) ([place])   . Chronic kidney disease    Tues, Thurs, Sat  . Diabetes mellitus without complication ([place])   . DVT (deep venous thrombosis) ([place])   . Dyspnea    with exertion  . Hyperlipidemia   . Hypertension   . Neuromuscular disorder ([place])   . Peripheral arterial disease ([place])   . Presence of permanent cardiac pacemaker    [employer]  . Stroke ([place])    light  . Thyroid disease     History reviewed. No pertinent family history.  SOCIAL HISTORY: Social History   Socioeconomic History  . Marital status: Single    Spouse name: Not on file  . Number of children: Not on file  . Years of education: Not on file  . Highest education level: Not on file  Occupational History  . Not on file  Tobacco Use  . Smoking status: Former Smoker    Years: 20.00    Types: Cigarettes    Quit date: 09/09/1986  Years since quitting: 33.5  . Smokeless tobacco: Never Used  Vaping Use  . Vaping Use: Never used  Substance and Sexual Activity  . Alcohol use: No  . Drug use: No  . Sexual activity: Not on file  Other Topics Concern  . Not on file  Social History Narrative  . Not on file   Social Determinants of Health   Financial Resource Strain:   . Difficulty of Paying Living Expenses:   Food Insecurity:   . Worried About Charity fundraiser in the Last Year:   . Arboriculturist in the Last Year:   Transportation Needs:   . Film/video editor (Medical):   Marland Kitchen Lack of Transportation (Non-Medical):   Physical Activity:   . Days of Exercise per Week:   . Minutes of  Exercise per Session:   Stress:   . Feeling of Stress :   Social Connections:   . Frequency of Communication with Friends and Family:   . Frequency of Social Gatherings with Friends and Family:   . Attends Religious Services:   . Active Member of Clubs or Organizations:   . Attends Archivist Meetings:   Marland Kitchen Marital Status:   Intimate Partner Violence:   . Fear of Current or Ex-Partner:   . Emotionally Abused:   Marland Kitchen Physically Abused:   . Sexually Abused:     Allergies  Allergen Reactions  . Penicillins Hives    Has patient had a PCN reaction causing immediate rash, facial/tongue/throat swelling, SOB or lightheadedness with hypotension: No Has patient had a PCN reaction causing severe rash involving mucus membranes or skin necrosis: No Has patient had a PCN reaction that required hospitalization: No Has patient had a PCN reaction occurring within the last 10 years: Yes If all of the above answers are "NO", then may proceed with Cephalosporin use.   Marland Kitchen Oxycodone Nausea And Vomiting    Current Outpatient Medications  Medication Sig Dispense Refill  . calcium carbonate (TUMS EX) 750 MG chewable tablet Chew 1 tablet by mouth daily as needed for heartburn.     . cinacalcet (SENSIPAR) 30 MG tablet Take 30 mg by mouth 2 (two) times daily with a meal.     . docusate sodium (COLACE) 100 MG capsule Take 100 mg by mouth daily as needed for moderate constipation.     Marland Kitchen ELIQUIS 5 MG TABS tablet Take 1 tablet (5 mg total) by mouth 2 (two) times daily. 60 tablet 6  . heparin 1000 unit/mL SOLN injection Heparin Sodium (Porcine) 1,000 Units/mL Systemic    . iron sucrose in sodium chloride 0.9 % 100 mL Iron Sucrose (Venofer)    . lidocaine-prilocaine (EMLA) cream APPLY SMALL AMOUNT TO ACCESS SITE (AVF) 1 TO 2 HOURS BEFORE DIALYSIS. COVER WITH OCCLUSIVE DRESSING (SARAN WRAP)  3  . midodrine (PROAMATINE) 2.5 MG tablet Take 2.5 mg by mouth See admin instructions. Take 2.5mg  by twice daily on  Tuesday, Thursday and Saturday  2  . pravastatin (PRAVACHOL) 20 MG tablet Take 20 mg by mouth daily.     . sucroferric oxyhydroxide (VELPHORO) 500 MG chewable tablet Chew 500 mg by mouth 3 (three) times daily with meals.     Marland Kitchen VITAMIN D, CHOLECALCIFEROL, PO Take by mouth.    . metoprolol succinate (TOPROL-XL) 25 MG 24 hr tablet Take 12.5 mg by mouth daily.    . traMADol (ULTRAM) 50 MG tablet Take 1 tablet (50 mg total) by mouth every 6 (six) hours  as needed. 20 tablet 0  . traMADol (ULTRAM) 50 MG tablet Take 1 tablet (50 mg total) by mouth every 6 (six) hours as needed. (Patient not taking: Reported on 03/18/2020) 20 tablet 0   No current facility-administered medications for this visit.    REVIEW OF SYSTEMS:  [X]  denotes positive finding, [ ]  denotes negative finding Cardiac  Comments:  Chest pain or chest pressure:    Shortness of breath upon exertion:    Short of breath when lying flat:    Irregular heart rhythm:        Vascular    Pain in calf, thigh, or hip brought on by ambulation:    Pain in feet at night that wakes you up from your sleep:     Blood clot in your veins:    Leg swelling:         Pulmonary    Oxygen at home:    Productive cough:     Wheezing:         Neurologic    Sudden weakness in arms or legs:     Sudden numbness in arms or legs:     Sudden onset of difficulty speaking or slurred speech:    Temporary loss of vision in one eye:     Problems with dizziness:         Gastrointestinal    Blood in stool:     Vomited blood:         Genitourinary    Burning when urinating:     Blood in urine:        Psychiatric    Major depression:         Hematologic    Bleeding problems:    Problems with blood clotting too easily:        Skin    Rashes or ulcers:        Constitutional    Fever or chills:     PHYSICAL EXAM:   Vitals:   03/18/20 1050  BP: (!) 149/85  Pulse: 74  Resp: 20  Temp: 97.7 F (36.5 C)  TempSrc: Temporal  SpO2: 96%  Weight:  193 lb 3.2 oz (87.6 kg)  Height: 5\' 4"  (1.626 m)    GENERAL: The patient is a well-nourished female, in no acute distress. The vital signs are documented above. CARDIAC: There is a regular rate and rhythm.  VASCULAR: I do not detect carotid bruits. On the right side she has a palpable radial pulse. On the left side she has a weaker radial pulse but the signal is biphasic.  She has a normal brachial pulse on the left. PULMONARY: There is good air exchange bilaterally without wheezing or rales. ABDOMEN: Soft and non-tender with normal pitched bowel sounds.  MUSCULOSKELETAL: There are no major deformities or cyanosis. NEUROLOGIC: No focal weakness or paresthesias are detected. SKIN: There are no ulcers or rashes noted. PSYCHIATRIC: The patient has a normal affect.  DATA:    UPPER EXTREMITY ARTERIAL DUPLEX: I have independently interpreted her upper extremity arterial duplex scan.  On the right side there is a biphasic radial and ulnar waveform.  The brachial artery measures 0.44 cm in diameter.  Of note the ulnar artery is small proximally.  On the left side there is a monophasic signal noted at the brachial level.  There is a biphasic radial signal.  On the left side the ulnar artery is noted to be very small.  UPPER EXTREMITY VEIN MAP: I have independently interpreted her  upper extremity vein map.  On the right side the forearm and upper arm cephalic vein are small.  The basilic vein is marginal in size.  On the left side the basilic vein is marginal in size.

## 2020-03-24 ENCOUNTER — Other Ambulatory Visit (HOSPITAL_COMMUNITY)
Admission: RE | Admit: 2020-03-24 | Discharge: 2020-03-24 | Disposition: A | Discharge status: Home / Self Care | Payer: Medicare HMO | Source: Ambulatory Visit | Attending: Vascular Surgery | Admitting: Vascular Surgery

## 2020-03-24 DIAGNOSIS — Z01812 Encounter for preprocedural laboratory examination: Secondary | ICD-10-CM | POA: Diagnosis present

## 2020-03-24 DIAGNOSIS — Z20822 Contact with and (suspected) exposure to covid-19: Secondary | ICD-10-CM | POA: Diagnosis not present

## 2020-03-24 LAB — SARS CORONAVIRUS 2 (TAT 6-24 HRS): SARS Coronavirus 2: NEGATIVE

## 2020-03-25 ENCOUNTER — Encounter (HOSPITAL_COMMUNITY): Payer: Self-pay | Admitting: Vascular Surgery

## 2020-03-25 ENCOUNTER — Other Ambulatory Visit: Payer: Self-pay

## 2020-03-25 NOTE — Progress Notes (Signed)
Spoke with patient's daughter Vangie Bicker cell [phone].  Denman George states patient does not have any SOB, fever, cough or chest pain.  PCP - Dr Tereasa Coop Cardiologist - Dr Kandis Cocking Nephrology - Dr Vella Redhead  Chest x-ray - 09/05/19 [facility] 2 Views EKG - 03/27/20 Stress Test - 09/2015 [facility]) ECHO - 02/12/20 [facility] Cardiac Cath - 07/2018 [facility]  ICD Pacemaker [employer]. Perioperative Cardiac Device Programming IB message sent 03/25/20.    Fasting Blood Sugar - Unknown Checks Blood Sugar __0_ times a day DM type 2, no meds, diet controlled.  Sleep Apnea - Yes CPAP - Does not use CPAP  Blood Thinner Instructions:  Follow your surgeon's instructions on when to stop prior to surgery.  Eliquis last dose on Monday, 03/23/20 per Daughter Denman George.  Anesthesia review: Yes  STOP now taking any Aspirin (unless otherwise instructed by your surgeon), Aleve, Naproxen, Ibuprofen, Motrin, Advil, Goody's, BC's, all herbal medications, fish oil, and all vitamins.   Coronavirus Screening Covid test on 03/24/20 was negative.  Patient verbalized understanding of instructions that were given via phone.

## 2020-03-26 ENCOUNTER — Encounter (HOSPITAL_COMMUNITY): Payer: Self-pay | Admitting: Vascular Surgery

## 2020-03-26 NOTE — Anesthesia Preprocedure Evaluation (Addendum)
Anesthesia Evaluation  Patient identified by MRN, date of birth, ID band Patient awake    Reviewed: Allergy & Precautions, NPO status , Patient's Chart, lab work & pertinent test results  Airway Mallampati: I  TM Distance: >3 FB Neck ROM: Full    Dental  (+) Edentulous Upper, Edentulous Lower   Pulmonary sleep apnea , former smoker,    breath sounds clear to auscultation       Cardiovascular hypertension, + Peripheral Vascular Disease and +CHF  + dysrhythmias Atrial Fibrillation + pacemaker  Rhythm:Regular Rate:Normal  Echo 02/12/20 ([facility]) 1. The left ventricle is normal in size with moderately increased wall  thickness.  2. The left ventricular systolic function is normal, LVEF is visually  estimated at 60-65%.  3. There is grade II diastolic dysfunction (elevated filling pressure).  4. Mitral annular calcification is present (severe).  5. The left atrium is mildly to moderately dilated in size.  6. The aortic valve is probably trileaflet with mildly thickened leaflets  with probably normal excursion.  7. The right ventricle is normal in size, with normalsystolic function.  8. There is mild tricuspid regurgitation.  9. There is borderline pulmonary hypertension, estimated pulmonary artery  systolic pressure is 32 mmHg.    Cardiac cath 07/24/18 [facility]):  1. Diffuse, non-obstructive coronary artery disease  2. Heavily calcified coronary arteries and aorta as well as airway  calcification  3. Normal left ventricular filling pressures (LVEDP = 11 mm Hg).  4. Preserved left ventricular systolic function (estimated LVEF = 50 %)  with focal area of distal inferior wall hypokinesis  5. No angiographic evidence of significant proximal renal artery stenosis  Recommendations:  1. Aggressive secondary prevention.  2. Followup with primary cardiologist.  3. Recommend formal echocardiogram     Neuro/Psych   Neuromuscular disease CVA negative psych ROS   GI/Hepatic negative GI ROS, Neg liver ROS,   Endo/Other  diabetes, Type 2  Renal/GU ESRF and DialysisRenal disease     Musculoskeletal  (+) Arthritis ,   Abdominal (+) + obese,   Peds  Hematology negative hematology ROS (+)   Anesthesia Other Findings   Reproductive/Obstetrics                           Anesthesia Physical Anesthesia Plan  ASA: III  Anesthesia Plan: MAC   Post-op Pain Management:    Induction: Intravenous  PONV Risk Score and Plan: 3 and Propofol infusion, Ondansetron and Treatment may vary due to age or medical condition  Airway Management Planned: Natural Airway and Simple Face Mask  Additional Equipment: None  Intra-op Plan:   Post-operative Plan:   Informed Consent: I have reviewed the patients History and Physical, chart, labs and discussed the procedure including the risks, benefits and alternatives for the proposed anesthesia with the patient or authorized representative who has indicated his/her understanding and acceptance.     Dental advisory given  Plan Discussed with: CRNA  Anesthesia Plan Comments: (PAT note written 03/26/2020 by Myra Gianotti, PA-C.  Also, consented for GA.)      Anesthesia Quick Evaluation

## 2020-03-26 NOTE — Progress Notes (Signed)
Anesthesia Chart Review: SAME DAY WORK-UP   Case: [id number] Date/Time: 03/27/20 1007   Procedures:      INSERTION OF ARTERIOVENOUS (AV) GORE-TEX GRAFT ARM (Left Arm Lower)     INSERTION OF DIALYSIS CATHETER (N/A )   Anesthesia type: Choice   Pre-op diagnosis: END STAGE RENAL DISEASE   Location: [facility] OR ROOM 12 / [place] OR   Surgeons: Rosetta Posner, MD      DISCUSSION: Patient is a 74 year old female scheduled for the above procedure.  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  May 2021 echo showed LVEFl 30-16%, grade 2 diastolic dysfunction, severe mitral annular calcification, trivial MR, mild-moderate dilated LA, mild TR, normal RV systolic function, PASP 32 mmHg. 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 Remote PPM check 03/10/20 (see below).   Remote PPM Check (outlined in [place]): - Visit Date: 03/10/2020 - Findings: Normal device function, Tested Lead measurements stable and within normal range, Adequate battery reserve-6.5 years - Arrhythmias: (2) ATR episodes; longest episode 2 seconds.  - Plan: Continue Routine Remote Monitoring - Last Device Clinic appointment: 03/27/2017; needs annual device check  - Manufacturer of Device: [employer]  - Type of Device: Dual Chamber Pacemaker - Presenting Rhythm: AS/VP  Last Eliquis 03/23/2020.  03/24/2020 presurgical COVID-19 test negative.  She is a same-day work-up so anesthesia team evaluation and labs on the day of surgery. If more recent EKG not received, then she would meet anesthesia criteria for preoperative EKG.    VS: Ht 5\' 3"  (1.6 m)   Wt 86.2 kg   LMP  (LMP Unknown)   BMI 33.66 kg/m  As of 03/18/20 BP 149/85, HR 74.    PROVIDERS: Alessandra Grout, MD is PCP - Kandis Cocking, MD  is cardiologist [facility]. See Care Everywhere). Seen by Lars Masson, AGNP on 09/05/19. Previous NSVT correlated with lightheadedness. Had hypotension on metoprolol. No recurrent arrhythmia on device checks on amiodarone. Having symptomatic hypotension on hemodialysis days (taking midodrine). Non-obstructive CAD on 2019 cath.  Vella Redhead, MD is nephrologist   LABS: For day of surgery. H/H 12.5/39.5 on 12/09/19 and A1c 6.2% 09/05/19 ([place])    IMAGES: CXR 09/05/19 [facility] CE): FINDINGS:  Unchanged positioning of right chest wall dual-lead pacer.  Radiographically clear lungs.  No pleural effusion or pneumothorax.  Stable cardiomediastinal silhouette.    EKG: Last EKG noted is > 87 year old.  Per [facility] CE, 07/20/18 tracing showed: ATRIAL-SENSED VENTRICULAR-PACED RHYTHM  ABNORMAL ECG  WHEN COMPARED WITH ECG OF 03-Jan-2018 14:26,  VENT. RATE HAS DECREASED BY 16 BPM  Confirmed by Hunt Oris ([id number]) on 07/20/2018 6:12:41 AM    CV: Echo 02/12/20 [facility] CE; appears echo was done for presyncope): Summary  1. The left ventricle is normal in size with moderately increased wall  thickness.  2. The left ventricular systolic function is normal, LVEF is visually  estimated at 60-65%.  3. There is grade II diastolic dysfunction (elevated filling pressure).  4. Mitral annular calcification is present (severe).  5. The left atrium is mildly to moderately dilated in size.  6. The aortic valve is probably trileaflet with mildly thickened leaflets  with probably normal excursion.  7. The right ventricle is normal in size, with normalsystolic function.  8. There is mild tricuspid regurgitation.  9. There is borderline pulmonary hypertension, estimated pulmonary artery  systolic pressure is 32 mmHg.    Cardiac cath 07/24/18 [facility] CE; done after PPM interrogation showed 17 beat run of VT): Findings:  1. Diffuse, non-obstructive coronary artery disease  2.  Heavily calcified coronary arteries and aorta as well as airway  calcification  3. Normal left ventricular filling pressures (LVEDP = 11 mm Hg).  4. Preserved left ventricular systolic function (estimated LVEF = 50 %)  with focal area of distal inferior wall hypokinesis  5. No angiographic evidence of significant proximal renal artery stenosis  Recommendations:  1. Aggressive secondary prevention.  2. Followup with primary cardiologist.  3. Recommend formal echocardiogram    Past Medical History:  Diagnosis Date  . Anemia   . Arthritis   . Cancer ([place])    Left Breast  . CHF (congestive heart failure) ([place])   . Chronic kidney disease    Tues, Thurs, Sat  . Diabetes mellitus without complication ([place])    type 2 - no meds diet controlled  . DVT (deep venous thrombosis) ([place])   . Dyspnea    with exertion  . Dysrhythmia    NSVT, PAF  . Hyperlipidemia   . Hypertension   . Hypotension    associated with hemodialysis  . Neuromuscular disorder ([place])   . Peripheral arterial disease ([place])   . 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   [employer]  . Sleep apnea    Does not use CPAP  . Stroke ([place])    light  . Thyroid disease     Past Surgical History:  Procedure Laterality Date  . ABDOMINAL HYSTERECTOMY     partial  . ANKLE FUSION Right 2014?   done at [facility]  . AV FISTULA PLACEMENT    . BREAST LUMPECTOMY Left   . COLONOSCOPY W/ POLYPECTOMY    . EYE SURGERY Bilateral    Cataract  . FISTULA SUPERFICIALIZATION Left 06/01/2018   Procedure: FISTULA PLICATION LEFT ARM;  Surgeon: Serafina Mitchell, MD;  Location: [place];  Service: Vascular;  Laterality: Left;  . PACEMAKER PLACEMENT  03/21/2015   [employer]  . REVISON OF ARTERIOVENOUS FISTULA Left 09/14/2016   Procedure: LEFT CEPHALIC TURNDOWN;  Surgeon: Conrad Quiogue, MD;  Location: [place];  Service: Vascular;  Laterality: Left;    MEDICATIONS: No current facility-administered medications for this  encounter.   Marland Kitchen amiodarone (PACERONE) 200 MG tablet  . atorvastatin (LIPITOR) 40 MG tablet  . cinacalcet (SENSIPAR) 30 MG tablet  . ELIQUIS 5 MG TABS tablet  . lidocaine-prilocaine (EMLA) cream  . midodrine (PROAMATINE) 5 MG tablet  . sucroferric oxyhydroxide (VELPHORO) 500 MG chewable tablet  . heparin 1000 unit/mL SOLN injection  . iron sucrose in sodium chloride 0.9 % 100 mL  . metoprolol succinate (TOPROL-XL) 25 MG 24 hr tablet     Myra Gianotti, PA-C Surgical Short Stay/Anesthesiology [facility] Phone [phone] [facility] Phone [phone] 03/26/2020 12:01 PM

## 2020-03-27 ENCOUNTER — Ambulatory Visit (HOSPITAL_COMMUNITY): Payer: Medicare HMO | Admitting: Vascular Surgery

## 2020-03-27 ENCOUNTER — Ambulatory Visit (HOSPITAL_COMMUNITY)
Admission: RE | Admit: 2020-03-27 | Discharge: 2020-03-27 | Disposition: A | Discharge status: Home / Self Care | Payer: Medicare HMO | Source: Ambulatory Visit | Attending: Vascular Surgery | Admitting: Vascular Surgery

## 2020-03-27 ENCOUNTER — Encounter (HOSPITAL_COMMUNITY)
Admission: RE | Disposition: A | Discharge status: Home / Self Care | Payer: Self-pay | Source: Ambulatory Visit | Attending: Vascular Surgery

## 2020-03-27 ENCOUNTER — Ambulatory Visit (HOSPITAL_COMMUNITY): Payer: Medicare HMO

## 2020-03-27 ENCOUNTER — Other Ambulatory Visit: Payer: Self-pay

## 2020-03-27 ENCOUNTER — Encounter (HOSPITAL_COMMUNITY): Payer: Self-pay | Admitting: Vascular Surgery

## 2020-03-27 DIAGNOSIS — Y832 Surgical operation with anastomosis, bypass or graft as the cause of abnormal reaction of the patient, or of later complication, without mention of misadventure at the time of the procedure: Secondary | ICD-10-CM | POA: Insufficient documentation

## 2020-03-27 DIAGNOSIS — Z87891 Personal history of nicotine dependence: Secondary | ICD-10-CM | POA: Insufficient documentation

## 2020-03-27 DIAGNOSIS — I509 Heart failure, unspecified: Secondary | ICD-10-CM | POA: Insufficient documentation

## 2020-03-27 DIAGNOSIS — E1122 Type 2 diabetes mellitus with diabetic chronic kidney disease: Secondary | ICD-10-CM | POA: Insufficient documentation

## 2020-03-27 DIAGNOSIS — Z8673 Personal history of transient ischemic attack (TIA), and cerebral infarction without residual deficits: Secondary | ICD-10-CM | POA: Insufficient documentation

## 2020-03-27 DIAGNOSIS — Z86718 Personal history of other venous thrombosis and embolism: Secondary | ICD-10-CM | POA: Insufficient documentation

## 2020-03-27 DIAGNOSIS — Z880 Allergy status to penicillin: Secondary | ICD-10-CM | POA: Insufficient documentation

## 2020-03-27 DIAGNOSIS — Z885 Allergy status to narcotic agent status: Secondary | ICD-10-CM | POA: Insufficient documentation

## 2020-03-27 DIAGNOSIS — I700 Atherosclerosis of aorta: Secondary | ICD-10-CM | POA: Insufficient documentation

## 2020-03-27 DIAGNOSIS — M1990 Unspecified osteoarthritis, unspecified site: Secondary | ICD-10-CM | POA: Insufficient documentation

## 2020-03-27 DIAGNOSIS — Z992 Dependence on renal dialysis: Secondary | ICD-10-CM

## 2020-03-27 DIAGNOSIS — G4730 Sleep apnea, unspecified: Secondary | ICD-10-CM | POA: Diagnosis not present

## 2020-03-27 DIAGNOSIS — I480 Paroxysmal atrial fibrillation: Secondary | ICD-10-CM | POA: Diagnosis not present

## 2020-03-27 DIAGNOSIS — Z7901 Long term (current) use of anticoagulants: Secondary | ICD-10-CM | POA: Diagnosis not present

## 2020-03-27 DIAGNOSIS — T82590A Other mechanical complication of surgically created arteriovenous fistula, initial encounter: Secondary | ICD-10-CM | POA: Diagnosis present

## 2020-03-27 DIAGNOSIS — Z950 Presence of cardiac pacemaker: Secondary | ICD-10-CM | POA: Insufficient documentation

## 2020-03-27 DIAGNOSIS — N186 End stage renal disease: Secondary | ICD-10-CM | POA: Insufficient documentation

## 2020-03-27 DIAGNOSIS — E785 Hyperlipidemia, unspecified: Secondary | ICD-10-CM | POA: Diagnosis not present

## 2020-03-27 DIAGNOSIS — I2510 Atherosclerotic heart disease of native coronary artery without angina pectoris: Secondary | ICD-10-CM | POA: Diagnosis not present

## 2020-03-27 DIAGNOSIS — I132 Hypertensive heart and chronic kidney disease with heart failure and with stage 5 chronic kidney disease, or end stage renal disease: Secondary | ICD-10-CM | POA: Diagnosis not present

## 2020-03-27 DIAGNOSIS — N185 Chronic kidney disease, stage 5: Secondary | ICD-10-CM

## 2020-03-27 DIAGNOSIS — Z79899 Other long term (current) drug therapy: Secondary | ICD-10-CM | POA: Diagnosis not present

## 2020-03-27 DIAGNOSIS — E1151 Type 2 diabetes mellitus with diabetic peripheral angiopathy without gangrene: Secondary | ICD-10-CM | POA: Diagnosis not present

## 2020-03-27 DIAGNOSIS — Z95828 Presence of other vascular implants and grafts: Secondary | ICD-10-CM

## 2020-03-27 DIAGNOSIS — Z419 Encounter for procedure for purposes other than remedying health state, unspecified: Secondary | ICD-10-CM

## 2020-03-27 HISTORY — DX: Hypotension, unspecified: I95.9

## 2020-03-27 HISTORY — PX: INSERTION OF DIALYSIS CATHETER: SHX1324

## 2020-03-27 HISTORY — DX: Anemia, unspecified: D64.9

## 2020-03-27 HISTORY — DX: Sleep apnea, unspecified: G47.30

## 2020-03-27 HISTORY — DX: Cardiac arrhythmia, unspecified: I49.9

## 2020-03-27 HISTORY — PX: AV FISTULA PLACEMENT: SHX1204

## 2020-03-27 LAB — POCT I-STAT, CHEM 8
BUN: 24 mg/dL — ABNORMAL HIGH (ref 8–23)
Calcium, Ion: 1.01 mmol/L — ABNORMAL LOW (ref 1.15–1.40)
Chloride: 95 mmol/L — ABNORMAL LOW (ref 98–111)
Creatinine, Ser: 8.1 mg/dL — ABNORMAL HIGH (ref 0.44–1.00)
Glucose, Bld: 94 mg/dL (ref 70–99)
HCT: 38 % (ref 36.0–46.0)
Hemoglobin: 12.9 g/dL (ref 12.0–15.0)
Potassium: 4.3 mmol/L (ref 3.5–5.1)
Sodium: 136 mmol/L (ref 135–145)
TCO2: 30 mmol/L (ref 22–32)

## 2020-03-27 LAB — GLUCOSE, CAPILLARY
Glucose-Capillary: 84 mg/dL (ref 70–99)
Glucose-Capillary: 80 mg/dL (ref 70–99)

## 2020-03-27 LAB — PROTIME-INR
INR: 1 (ref 0.8–1.2)
Prothrombin Time: 13.1 seconds (ref 11.4–15.2)

## 2020-03-27 SURGERY — INSERTION OF ARTERIOVENOUS (AV) GORE-TEX GRAFT ARM
Anesthesia: Monitor Anesthesia Care | Site: Neck | Laterality: Left

## 2020-03-27 MED ORDER — FENTANYL CITRATE (PF) 100 MCG/2ML IJ SOLN
INTRAMUSCULAR | Status: DC | PRN
  Administered 2020-03-27 (×5): 25 ug via INTRAVENOUS

## 2020-03-27 MED ORDER — LIDOCAINE HCL (CARDIAC) PF 100 MG/5ML IV SOSY
PREFILLED_SYRINGE | INTRAVENOUS | Status: DC | PRN
Start: 2020-03-27 — End: 2020-03-27
  Administered 2020-03-27: 30 mg via INTRATRACHEAL

## 2020-03-27 MED ORDER — SODIUM CHLORIDE 0.9 % IV SOLN
INTRAVENOUS | Status: DC | PRN
  Administered 2020-03-27: 500 mL

## 2020-03-27 MED ORDER — CIPROFLOXACIN IN D5W 400 MG/200ML IV SOLN
400.0000 mg | INTRAVENOUS | Status: AC
  Administered 2020-03-27: 400 mg via INTRAVENOUS
  Filled 2020-03-27: qty 200

## 2020-03-27 MED ORDER — ELIQUIS 5 MG PO TABS: 5.0000 mg | ORAL_TABLET | Freq: Two times a day (BID) | ORAL | Status: DC

## 2020-03-27 MED ORDER — 0.9 % SODIUM CHLORIDE (POUR BTL) OPTIME
TOPICAL | Status: DC | PRN
  Administered 2020-03-27: 1000 mL

## 2020-03-27 MED ORDER — SODIUM CHLORIDE 0.9 % IV SOLN: INTRAVENOUS | Status: DC

## 2020-03-27 MED ORDER — LIDOCAINE-EPINEPHRINE 0.5 %-1:200000 IJ SOLN
INTRAMUSCULAR | Status: AC
  Filled 2020-03-27: qty 1

## 2020-03-27 MED ORDER — LIDOCAINE-EPINEPHRINE (PF) 1 %-1:200000 IJ SOLN
INTRAMUSCULAR | Status: AC
  Filled 2020-03-27: qty 30

## 2020-03-27 MED ORDER — TRAMADOL HCL 50 MG PO TABS
50.0000 mg | ORAL_TABLET | Freq: Once | ORAL | Status: AC
  Administered 2020-03-27: 50 mg via ORAL

## 2020-03-27 MED ORDER — SODIUM CHLORIDE 0.9 % IV SOLN: INTRAVENOUS | Status: DC | PRN

## 2020-03-27 MED ORDER — ORAL CARE MOUTH RINSE: 15.0000 mL | Freq: Once | OROMUCOSAL | Status: AC

## 2020-03-27 MED ORDER — CHLORHEXIDINE GLUCONATE 4 % EX LIQD: 60.0000 mL | Freq: Once | CUTANEOUS | Status: DC

## 2020-03-27 MED ORDER — SODIUM CHLORIDE 0.9 % IV SOLN
INTRAVENOUS | Status: AC
  Filled 2020-03-27: qty 1.2

## 2020-03-27 MED ORDER — HEPARIN SODIUM (PORCINE) 1000 UNIT/ML IJ SOLN
INTRAMUSCULAR | Status: AC
  Filled 2020-03-27: qty 1

## 2020-03-27 MED ORDER — TRAMADOL HCL 50 MG PO TABS: 50.0000 mg | ORAL_TABLET | Freq: Four times a day (QID) | ORAL | 0 refills | Status: DC | PRN

## 2020-03-27 MED ORDER — HEPARIN SODIUM (PORCINE) 1000 UNIT/ML IJ SOLN
INTRAMUSCULAR | Status: DC | PRN
  Administered 2020-03-27: 4200 [IU]

## 2020-03-27 MED ORDER — LIDOCAINE-EPINEPHRINE 0.5 %-1:200000 IJ SOLN
INTRAMUSCULAR | Status: DC | PRN
  Administered 2020-03-27: 15 mL

## 2020-03-27 MED ORDER — PROPOFOL 10 MG/ML IV BOLUS
INTRAVENOUS | Status: DC | PRN
  Administered 2020-03-27: 30 mg via INTRAVENOUS

## 2020-03-27 MED ORDER — TRAMADOL HCL 50 MG PO TABS
ORAL_TABLET | ORAL | Status: AC
  Filled 2020-03-27: qty 1

## 2020-03-27 MED ORDER — PROPOFOL 500 MG/50ML IV EMUL
INTRAVENOUS | Status: DC | PRN
  Administered 2020-03-27: 50 ug/kg/min via INTRAVENOUS

## 2020-03-27 MED ORDER — CHLORHEXIDINE GLUCONATE 0.12 % MT SOLN
15.0000 mL | Freq: Once | OROMUCOSAL | Status: AC
  Administered 2020-03-27: 15 mL via OROMUCOSAL
  Filled 2020-03-27: qty 15

## 2020-03-27 MED ORDER — FENTANYL CITRATE (PF) 250 MCG/5ML IJ SOLN
INTRAMUSCULAR | Status: AC
  Filled 2020-03-27: qty 5

## 2020-03-27 SURGICAL SUPPLY — 67 items
ARMBAND PINK RESTRICT EXTREMIT (MISCELLANEOUS) ×8 IMPLANT
BAG DECANTER FOR FLEXI CONT (MISCELLANEOUS) ×4 IMPLANT
BIOPATCH RED 1 DISK 7.0 (GAUZE/BANDAGES/DRESSINGS) ×3 IMPLANT
BIOPATCH RED 1IN DISK 7.0MM (GAUZE/BANDAGES/DRESSINGS) ×1
CANISTER SUCT 3000ML PPV (MISCELLANEOUS) ×4 IMPLANT
CANNULA VESSEL 3MM 2 BLNT TIP (CANNULA) ×4 IMPLANT
CATH PALINDROME-P 19CM W/VT (CATHETERS) IMPLANT
CATH PALINDROME-P 23CM W/VT (CATHETERS) ×4 IMPLANT
CATH PALINDROME-P 28CM W/VT (CATHETERS) IMPLANT
CLIP LIGATING EXTRA MED SLVR (CLIP) ×4 IMPLANT
CLIP LIGATING EXTRA SM BLUE (MISCELLANEOUS) ×4 IMPLANT
CNTNR URN SCR LID CUP LEK RST (MISCELLANEOUS) ×2 IMPLANT
CONT SPEC 4OZ STRL OR WHT (MISCELLANEOUS) ×4
COVER PROBE W GEL 5X96 (DRAPES) ×4 IMPLANT
COVER SURGICAL LIGHT HANDLE (MISCELLANEOUS) ×4 IMPLANT
COVER WAND RF STERILE (DRAPES) ×4 IMPLANT
DECANTER SPIKE VIAL GLASS SM (MISCELLANEOUS) ×4 IMPLANT
DERMABOND ADVANCED (GAUZE/BANDAGES/DRESSINGS) ×4
DERMABOND ADVANCED .7 DNX12 (GAUZE/BANDAGES/DRESSINGS) ×4 IMPLANT
DRAPE C-ARM 42X72 X-RAY (DRAPES) ×4 IMPLANT
DRAPE CHEST BREAST 15X10 FENES (DRAPES) ×4 IMPLANT
ELECT REM PT RETURN 9FT ADLT (ELECTROSURGICAL) ×4
ELECTRODE REM PT RTRN 9FT ADLT (ELECTROSURGICAL) ×2 IMPLANT
GAUZE 4X4 16PLY RFD (DISPOSABLE) ×4 IMPLANT
GAUZE SPONGE 4X4 12PLY STRL (GAUZE/BANDAGES/DRESSINGS) ×4 IMPLANT
GLOVE BIO SURGEON STRL SZ 6.5 (GLOVE) ×3 IMPLANT
GLOVE BIO SURGEON STRL SZ7 (GLOVE) ×4 IMPLANT
GLOVE BIO SURGEONS STRL SZ 6.5 (GLOVE) ×1
GLOVE BIOGEL PI IND STRL 6.5 (GLOVE) ×4 IMPLANT
GLOVE BIOGEL PI IND STRL 7.0 (GLOVE) ×2 IMPLANT
GLOVE BIOGEL PI INDICATOR 6.5 (GLOVE) ×4
GLOVE BIOGEL PI INDICATOR 7.0 (GLOVE) ×2
GLOVE SS BIOGEL STRL SZ 7.5 (GLOVE) ×4 IMPLANT
GLOVE SUPERSENSE BIOGEL SZ 7.5 (GLOVE) ×4
GLOVE SURG SS PI 6.5 STRL IVOR (GLOVE) ×8 IMPLANT
GOWN STRL NON-REIN LRG LVL3 (GOWN DISPOSABLE) ×8 IMPLANT
GOWN STRL REUS W/ TWL LRG LVL3 (GOWN DISPOSABLE) ×6 IMPLANT
GOWN STRL REUS W/TWL LRG LVL3 (GOWN DISPOSABLE) ×12
GRAFT GORETEX STRT 4-7X45 (Vascular Products) ×4 IMPLANT
KIT BASIN OR (CUSTOM PROCEDURE TRAY) ×4 IMPLANT
KIT PALINDROME-P 55CM (CATHETERS) IMPLANT
KIT TURNOVER KIT B (KITS) ×4 IMPLANT
NEEDLE 18GX1X1/2 (RX/OR ONLY) (NEEDLE) ×4 IMPLANT
NEEDLE 22X1 1/2 (OR ONLY) (NEEDLE) IMPLANT
NEEDLE HYPO 25GX1X1/2 BEV (NEEDLE) ×4 IMPLANT
NS IRRIG 1000ML POUR BTL (IV SOLUTION) ×4 IMPLANT
PACK CV ACCESS (CUSTOM PROCEDURE TRAY) ×4 IMPLANT
PACK SURGICAL SETUP 50X90 (CUSTOM PROCEDURE TRAY) ×4 IMPLANT
PAD ARMBOARD 7.5X6 YLW CONV (MISCELLANEOUS) ×8 IMPLANT
SOAP 2 % CHG 4 OZ (WOUND CARE) ×4 IMPLANT
SUT ETHILON 3 0 PS 1 (SUTURE) ×4 IMPLANT
SUT MNCRL AB 4-0 PS2 18 (SUTURE) ×8 IMPLANT
SUT PROLENE 5 0 C 1 24 (SUTURE) ×4 IMPLANT
SUT PROLENE 6 0 CC (SUTURE) ×16 IMPLANT
SUT SILK 2 0 PERMA HAND 18 BK (SUTURE) IMPLANT
SUT VIC AB 3-0 SH 27 (SUTURE) ×8
SUT VIC AB 3-0 SH 27X BRD (SUTURE) ×4 IMPLANT
SUT VICRYL 4-0 PS2 18IN ABS (SUTURE) ×4 IMPLANT
SYR 10ML LL (SYRINGE) ×4 IMPLANT
SYR 20ML LL LF (SYRINGE) ×4 IMPLANT
SYR 5ML LL (SYRINGE) ×8 IMPLANT
SYR CONTROL 10ML LL (SYRINGE) ×4 IMPLANT
SYR TOOMEY 50ML (SYRINGE) IMPLANT
TOWEL GREEN STERILE (TOWEL DISPOSABLE) ×8 IMPLANT
TOWEL GREEN STERILE FF (TOWEL DISPOSABLE) ×4 IMPLANT
UNDERPAD 30X36 HEAVY ABSORB (UNDERPADS AND DIAPERS) ×4 IMPLANT
WATER STERILE IRR 1000ML POUR (IV SOLUTION) ×4 IMPLANT

## 2020-03-27 NOTE — Anesthesia Postprocedure Evaluation (Signed)
Anesthesia Post Note  Patient: Stacy Hammond  Procedure(s) Performed: INSERTION OF LEFT UPPER ARM ARTERIOVENOUS (AV) GORE-TEX GRAFT ARM USING 4X7MM X 45 CM STRETCH GORETEX GRAFT (Left Arm Upper) INSERTION OF TUNNELED DIALYSIS CATHETER USING PALINDROME 23CM (Left Neck)     Patient location during evaluation: PACU Anesthesia Type: MAC Level of consciousness: awake and alert Pain management: pain level controlled Vital Signs Assessment: post-procedure vital signs reviewed and stable Respiratory status: spontaneous breathing, nonlabored ventilation, respiratory function stable and patient connected to nasal cannula oxygen Cardiovascular status: stable and blood pressure returned to baseline Postop Assessment: no apparent nausea or vomiting Anesthetic complications: no   No complications documented.  Last Vitals:  Vitals:   03/27/20 1350 03/27/20 1404  BP: (!) 131/59 132/61  Pulse: 73 73  Resp: 16 12  Temp:  (!) 36.3 C  SpO2: 95% 96%    Last Pain:  Vitals:   03/27/20 1404  PainSc: [name]

## 2020-03-27 NOTE — Transfer of Care (Signed)
Immediate Anesthesia Transfer of Care Note  Patient: Stacy Hammond  Procedure(s) Performed: INSERTION OF LEFT UPPER ARM ARTERIOVENOUS (AV) GORE-TEX GRAFT ARM USING 4X7MM X 45 CM STRETCH GORETEX GRAFT (Left Arm Upper) INSERTION OF TUNNELED DIALYSIS CATHETER USING PALINDROME 23CM (Left Neck)  Patient Location: PACU  Anesthesia Type:MAC  Level of Consciousness: awake, alert  and oriented  Airway & Oxygen Therapy: Patient Spontanous Breathing and Patient connected to nasal cannula oxygen  Post-op Assessment: Report given to RN, Post -op Vital signs reviewed and stable and Patient moving all extremities X 4  Post vital signs: Reviewed and stable  Last Vitals:  Vitals Value Taken Time  BP 118/52 03/27/20 1319  Temp    Pulse 80 03/27/20 1320  Resp 15 03/27/20 1320  SpO2 97 % 03/27/20 1320  Vitals shown include unvalidated device data.  Last Pain:  Vitals:   03/27/20 0815  PainSc: 0-No pain         Complications: No complications documented.

## 2020-03-27 NOTE — Discharge Instructions (Signed)
[facility]  Discharge Instructions  AV Fistula or Graft Surgery for Dialysis Access  Please refer to the following instructions for your post-procedure care. Your surgeon or physician assistant will discuss any changes with you.  Activity  You may drive the day following your surgery, if you are comfortable and no longer taking prescription pain medication. Resume full activity as the soreness in your incision resolves.  Bathing/Showering  You may shower after you go home. Keep your incision dry for 48 hours. Do not soak in a bathtub, hot tub, or swim until the incision heals completely. You may not shower if you have a hemodialysis catheter.  Incision Care  Clean your incision with mild soap and water after 48 hours. Pat the area dry with a clean towel. You do not need a bandage unless otherwise instructed. Do not apply any ointments or creams to your incision. You may have skin glue on your incision. Do not peel it off. It will come off on its own in about one week. Your arm may swell a bit after surgery. To reduce swelling use pillows to elevate your arm so it is above your heart. Your doctor will tell you if you need to lightly wrap your arm with an ACE bandage.  Diet  Resume your normal diet. There are not special food restrictions following this procedure. In order to heal from your surgery, it is CRITICAL to get adequate nutrition. Your body requires vitamins, minerals, and protein. Vegetables are the best source of vitamins and minerals. Vegetables also provide the perfect balance of protein. Processed food has little nutritional value, so try to avoid this.  Medications  Resume taking all of your medications. If your incision is causing pain, you may take over-the counter pain relievers such as acetaminophen (Tylenol). If you were prescribed a stronger pain medication, please be aware these medications can cause nausea and constipation. Prevent  nausea by taking the medication with a snack or meal. Avoid constipation by drinking plenty of fluids and eating foods with high amount of fiber, such as fruits, vegetables, and grains.  Do not take Tylenol if you are taking prescription pain medications.  Follow up Your surgeon may want to see you in the office following your access surgery. If so, this will be arranged at the time of your surgery.  Please call us immediately for any of the following conditions:  . Increased pain, redness, drainage (pus) from your incision site . Fever of 101 degrees or higher . Severe or worsening pain at your incision site . Hand pain or numbness. .  Reduce your risk of vascular disease:  . Stop smoking. If you would like help, call [facility] at 1-800-QUIT-NOW (272) 738-6963) or [place] at [phone]  . Manage your cholesterol . Maintain a desired weight . Control your diabetes . Keep your blood pressure down  Dialysis  It will take several weeks to several months for your new dialysis access to be ready for use. Your surgeon will determine when it is okay to use it. Your nephrologist will continue to direct your dialysis. You can continue to use your Permcath until your new access is ready for use.   03/27/2020 Stacy Hammond [id number] May 31, 1946  Surgeon(s): Early, Arvilla Meres, MD  Procedure(s): INSERTION OF LEFT UPPER ARM ARTERIOVENOUS (AV) GORE-TEX GRAFT ARM USING 4X7MM X 45 CM STRETCH GORETEX GRAFT INSERTION OF TUNNELED DIALYSIS CATHETER USING PALINDROME 23CM  x Do not stick graft for 4 weeks  If you have any questions, please call the office at [phone].

## 2020-03-27 NOTE — Op Note (Signed)
OPERATIVE REPORT  DATE OF SURGERY: 03/27/2020  PATIENT: Stacy Hammond, 74 y.o. female MRN: [id number]  DOB: [birth year]  PRE-OPERATIVE DIAGNOSIS: End-stage renal disease  POST-OPERATIVE DIAGNOSIS:  Same  PROCEDURE: #1 left IJ tunneled hemodialysis catheter, #2 left upper arm AV Gore-Tex graft  SURGEON:  Curt Jews, M.D.  PHYSICIAN ASSISTANT: Liana Crocker, PA-C  ANESTHESIA: Local with sedation  EBL: per anesthesia record  Total I/O In: 450 [I.V.:250; IV Piggyback:200] Out: -   BLOOD ADMINISTERED: none  DRAINS: none  SPECIMEN: none  COUNTS CORRECT:  YES  PATIENT DISPOSITION:  PACU - hemodynamically stable  PROCEDURE DETAILS: Patient was taken operating placed supine position where the area of the right and left neck and chest prepped draped you sterile fashion.  SonoSite ultrasound was used to visualize the internal jugular vein on the left which was widely patent.  The patient had pacemaker in the right.  Using local anesthesia and SonoSite visualization an 18-gauge was used to access the left internal jugular vein.  A guidewire was passed through the 18-gauge needle down to the level of the distal right atrium this was confirmed with fluoroscopy.  Dilator and peel-away sheath was passed over the guidewire and the dilator was removed.  The 28 cm tunnel catheter was positioned over the guidewire through the peel-away sheath down to the level of the distal right atrium.  This was then brought through a subcutaneous tunnel through a separate stab incision.  The tips were positioned at the level of the distal right atrium.  The guidewire was removed.  Both lumens flushed flushed and aspirated easily and were locked with 1000 unit/cc heparin.  Catheter was secured to the skin with 3-0 nylon stitch and the entry site was closed with a 4 subcuticular Vicryl stitch.  Attention was then turned to the left arm.  SonoSite revealed some small basilic vein.  The patient had a left upper  arm cephalic fistula had had multiple wrist revisions and was no longer operable.  Incision was made over the brachial anastomosis and carried down to isolate the brachial artery to vein.  There were stents and other endovascular devices that were present at the site.  Separate incision was made at the axilla and the axillary vein was identified.  It appeared that there had been a cephalic vein turndown to the axillary vein.  The vein was of excellent caliber.  A tunnel was created from the level of antecubital incision to the axillary incision.  A 4-7 taper graft was brought through the tunnel.  The brachial artery was occluded proximally distally and was transected the fistula near this.  The endovascular device that had crossed the right brachial artery was removed.  The Gore-Tex graft was transected and approximately the 5 mm area and was spatulated and sewn end-to-side to the brachial artery with a running 6-0 Prolene suture.  This anastomosis was tested and found to be adequate.  The graft was flushed with heparinized saline and reoccluded.  Next the axillary vein was occluded distally was ligated proximally as this was the fistula.  The vein was spatulated and the Gore-Tex graft was cut to the appropriate length and was sewn into into the axillary vein with a running 6-0 Prolene suture.  Clamps removed and excellent thrill was noted.  The wounds irrigated with saline.  Hemostasis electrocautery.  Wounds were closed with 3-0 Vicryl in the subcutaneous tissue and the skin was closed with 4 subcuticular Vicryl stitch.  Sterile dressing was applied  and the patient was transferred to the recovery room where chest x-rays pending   Rosetta Posner, M.D., [facility] 03/27/2020 12:55 PM

## 2020-03-27 NOTE — Interval H&P Note (Signed)
History and Physical Interval Note:  03/27/2020 9:57 AM  Stacy Hammond  has presented today for surgery, with the diagnosis of END STAGE RENAL DISEASE.  The various methods of treatment have been discussed with the patient and family. After consideration of risks, benefits and other options for treatment, the patient has consented to  Procedure(s): INSERTION OF ARTERIOVENOUS (AV) GORE-TEX GRAFT ARM (Left) INSERTION OF DIALYSIS CATHETER (N/A) as a surgical intervention.  The patient's history has been reviewed, patient examined, no change in status, stable for surgery.  I have reviewed the patient's chart and labs.  Questions were answered to the patient's satisfaction.     Curt Jews

## 2020-03-27 NOTE — Progress Notes (Signed)
Dr. Smith Robert made aware that patient has [employer] pacemaker that is managed by a doctor within the [facility] healthcare system, Dr. Charletta Cousin.  Patient's last check was on 03/10/2020.

## 2020-03-27 NOTE — Anesthesia Procedure Notes (Signed)
Procedure Name: MAC Date/Time: 03/27/2020 11:03 AM Performed by: Neldon Newport, CRNA Pre-anesthesia Checklist: Patient identified, Suction available, Emergency Drugs available and Patient being monitored Patient Re-evaluated:Patient Re-evaluated prior to induction Oxygen Delivery Method: Simple face mask Preoxygenation: Pre-oxygenation with 100% oxygen Induction Type: IV induction Dental Injury: Teeth and Oropharynx as per pre-operative assessment

## 2020-03-30 ENCOUNTER — Encounter (HOSPITAL_COMMUNITY): Payer: Self-pay | Admitting: Vascular Surgery

## 2020-04-29 ENCOUNTER — Ambulatory Visit (INDEPENDENT_AMBULATORY_CARE_PROVIDER_SITE_OTHER): Payer: Self-pay | Admitting: Physician Assistant

## 2020-04-29 ENCOUNTER — Other Ambulatory Visit: Payer: Self-pay

## 2020-04-29 VITALS — BP 146/80 | HR 75 | Temp 97.2°F | Resp 20 | Ht 63.0 in | Wt 187.0 lb

## 2020-04-29 DIAGNOSIS — N186 End stage renal disease: Secondary | ICD-10-CM

## 2020-04-29 DIAGNOSIS — Z992 Dependence on renal dialysis: Secondary | ICD-10-CM

## 2020-04-29 NOTE — Progress Notes (Signed)
  POST OPERATIVE OFFICE NOTE    CC:  F/u for surgery  HPI:  This is a 74 y.o. female who is s/p  #1 left IJ tunneled hemodialysis catheter, #2 left upper arm AV Gore-Tex graft  on 03/27/20 by Dr. Donnetta Hutching.    Pt returns today for follow up.    Allergies  Allergen Reactions  . Penicillins Hives    Has patient had a PCN reaction causing immediate rash, facial/tongue/throat swelling, SOB or lightheadedness with hypotension: No Has patient had a PCN reaction causing severe rash involving mucus membranes or skin necrosis: No Has patient had a PCN reaction that required hospitalization: No Has patient had a PCN reaction occurring within the last 10 years: Yes If all of the above answers are "NO", then may proceed with Cephalosporin use.   Marland Kitchen Oxycodone Nausea And Vomiting    Current Outpatient Medications  Medication Sig Dispense Refill  . amiodarone (PACERONE) 200 MG tablet Take 200 mg by mouth daily.    Marland Kitchen atorvastatin (LIPITOR) 40 MG tablet Take 40 mg by mouth daily.    . cinacalcet (SENSIPAR) 30 MG tablet Take 30 mg by mouth daily with breakfast.     . ELIQUIS 5 MG TABS tablet Take 1 tablet (5 mg total) by mouth 2 (two) times daily.    . heparin 1000 unit/mL SOLN injection Heparin Sodium (Porcine) 1,000 Units/mL Systemic    . iron sucrose in sodium chloride 0.9 % 100 mL Iron Sucrose (Venofer)    . lidocaine-prilocaine (EMLA) cream Apply 1 application topically See admin instructions. Apply 1 to 2 hours before dialysis  3  . midodrine (PROAMATINE) 5 MG tablet Take 5 mg by mouth See admin instructions. Take 5 mg by twice daily on Tuesday, Thursday and Saturday  2  . sucroferric oxyhydroxide (VELPHORO) 500 MG chewable tablet Chew 1,000 mg by mouth 3 (three) times daily with meals.     . traMADol (ULTRAM) 50 MG tablet Take 1 tablet (50 mg total) by mouth every 6 (six) hours as needed. 20 tablet 0  . metoprolol succinate (TOPROL-XL) 25 MG 24 hr tablet Take 12.5 mg by mouth daily.     No  current facility-administered medications for this visit.     ROS:  See HPI  Physical Exam:    Incision:  Well healed with palpable thrill Extremities:  Grip 5/5, sensation intact, palpable radial pulse   Assessment/Plan:  This is a 74 y.o. female who is s/p: #1 left IJ tunneled hemodialysis catheter, #2 left upper arm AV Gore-Tex graft  She is currently on HD via left TDC TTS in [place] followed by Dr. Johnney Ou.The graft may be accessed now.  Once it is working well she can be scheduled for [facility] removal at [facility].  F/U PRN.  Roxy Horseman PA-C [facility] [phone]  Clinic MD:  Scot Dock

## 2022-02-16 IMAGING — DX DG CHEST 1V PORT
1 series · 1 of 1 positions shown · non-contrast
Comparison: September 05, 2019.

CLINICAL DATA: Postoperative status.

EXAM:
PORTABLE CHEST 1 VIEW

[chest]
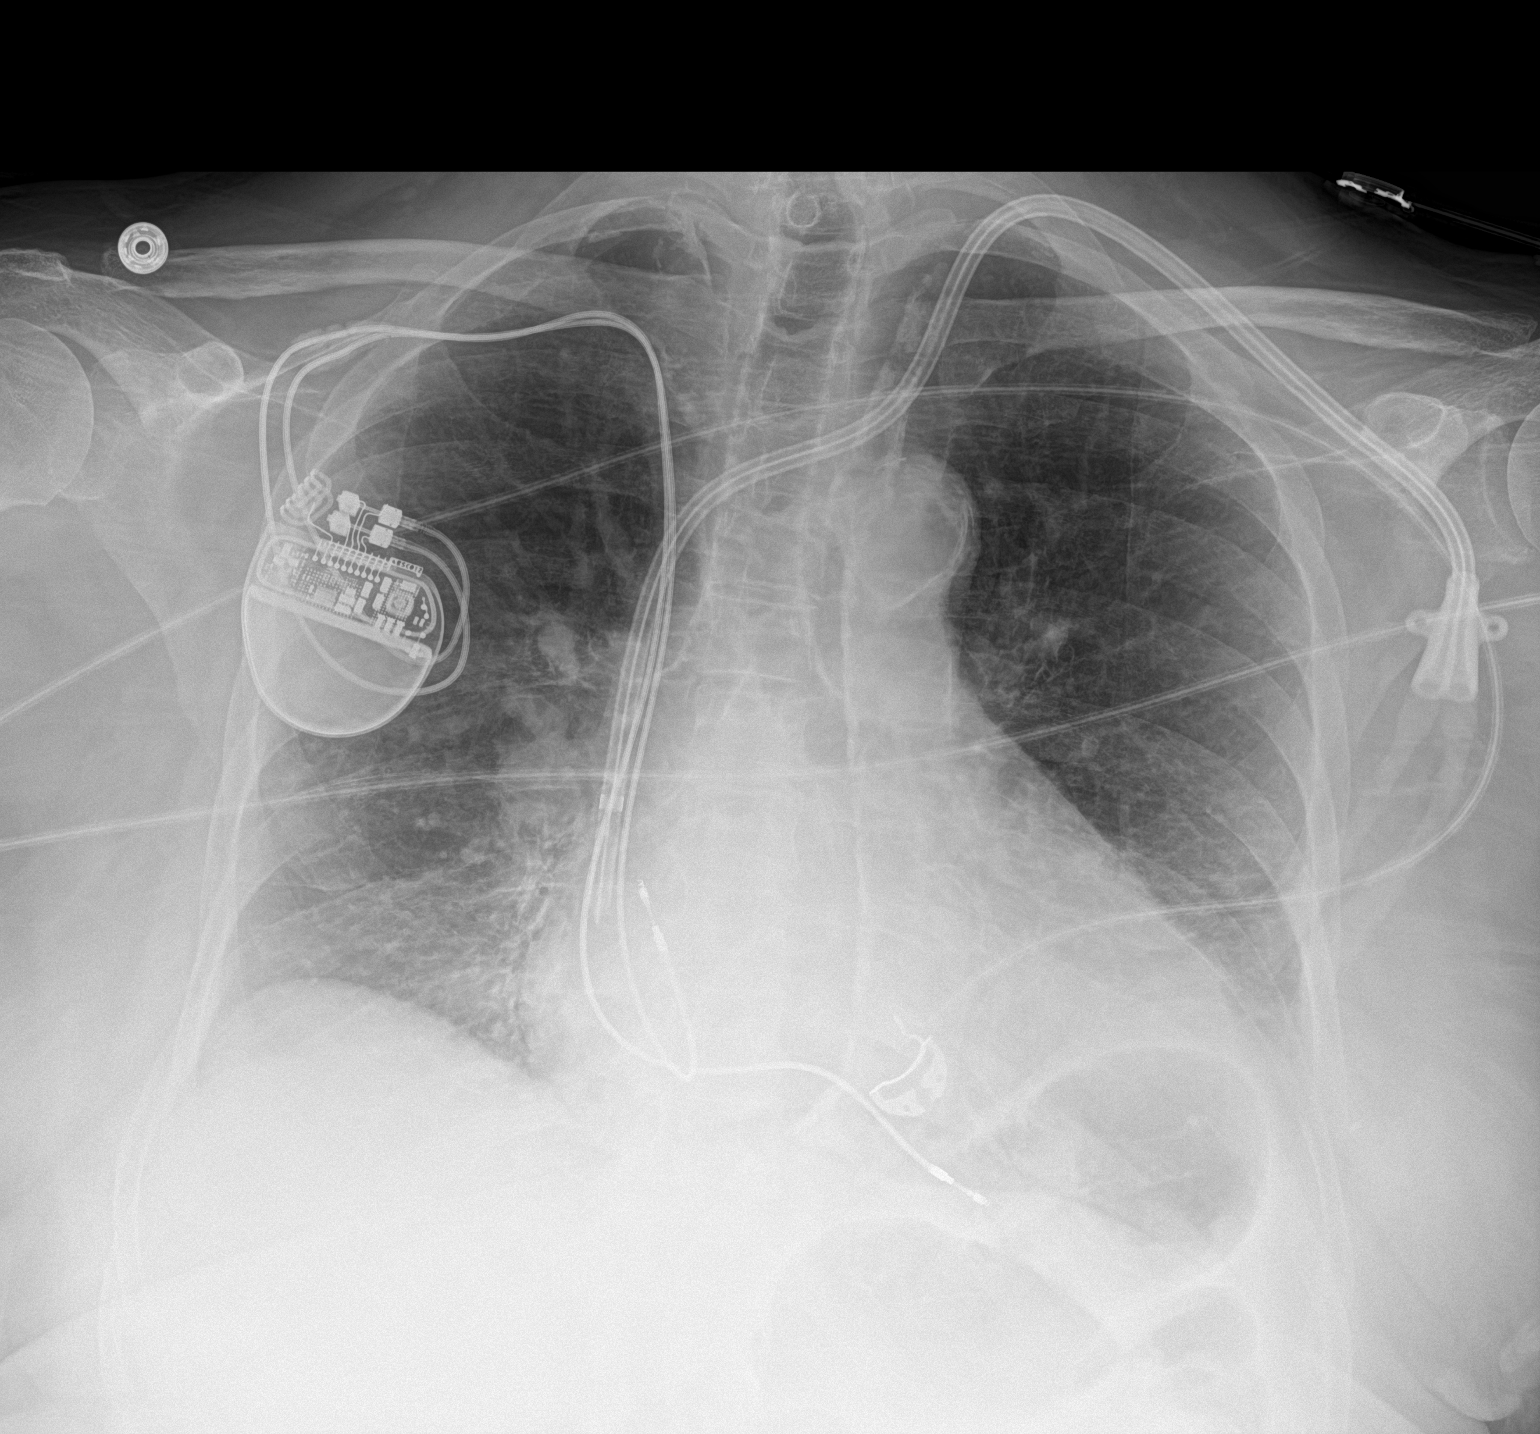

[1 of 1 positions shown; findings below may reference images not displayed]

FINDINGS: Stable cardiomediastinal silhouette. Right-sided pacemaker is
unchanged in position. Interval placement of left internal jugular
dialysis catheter with distal tips in the right atrium. No
pneumothorax or pleural effusion is noted. Minimal bibasilar
subsegmental atelectasis is noted. Bony thorax is unremarkable.
IMPRESSION: Minimal bibasilar subsegmental atelectasis.

Aortic Atherosclerosis ([id number]-[id number].D).

## 2022-03-08 ENCOUNTER — Other Ambulatory Visit: Payer: Self-pay | Admitting: *Deleted

## 2022-03-08 DIAGNOSIS — N186 End stage renal disease: Secondary | ICD-10-CM

## 2022-03-16 ENCOUNTER — Ambulatory Visit (INDEPENDENT_AMBULATORY_CARE_PROVIDER_SITE_OTHER)
Admission: RE | Admit: 2022-03-16 | Discharge: 2022-03-16 | Disposition: A | Discharge status: Home / Self Care | Payer: Medicare Other | Source: Ambulatory Visit | Attending: Vascular Surgery | Admitting: Vascular Surgery

## 2022-03-16 ENCOUNTER — Encounter: Payer: Self-pay | Admitting: Vascular Surgery

## 2022-03-16 ENCOUNTER — Ambulatory Visit (HOSPITAL_COMMUNITY)
Admission: RE | Admit: 2022-03-16 | Discharge: 2022-03-16 | Disposition: A | Discharge status: Home / Self Care | Payer: Medicare Other | Source: Ambulatory Visit | Attending: Vascular Surgery | Admitting: Vascular Surgery

## 2022-03-16 ENCOUNTER — Ambulatory Visit (INDEPENDENT_AMBULATORY_CARE_PROVIDER_SITE_OTHER): Payer: Medicare Other | Admitting: Vascular Surgery

## 2022-03-16 VITALS — BP 135/87 | HR 69 | Temp 98.0°F | Resp 20 | Ht 63.0 in | Wt 180.0 lb

## 2022-03-16 DIAGNOSIS — Z992 Dependence on renal dialysis: Secondary | ICD-10-CM | POA: Diagnosis not present

## 2022-03-16 DIAGNOSIS — N186 End stage renal disease: Secondary | ICD-10-CM

## 2022-03-16 NOTE — H&P (View-Only) (Signed)
Patient ID: Stacy Hammond, female   DOB: 04-27-1946, 76 y.o.   MRN: [id number]  Reason for Consult: Follow-up   Referred by Stacy Mend, MD  Subjective:     HPI:  Stacy Hammond is a 76 y.o. female history of multiple previous left-sided upper arm AV access procedures.  She continues on dialysis on Tuesdays Thursdays and Saturdays.  She is on Eliquis.  Most recently thrombosed the graft in the left upper arm now on dialysis via catheter.  Not having any issues with her left-sided catheter at this time.  Denies any previous right arm access procedures or other surgeries.  She is right-hand dominant.  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 No pertinent family history. Past Surgical History:  Procedure Laterality Date   ABDOMINAL HYSTERECTOMY     partial   ANKLE FUSION Right 2014?   done at [place] Left 03/27/2020   Procedure: INSERTION OF LEFT UPPER ARM ARTERIOVENOUS (AV) GORE-TEX GRAFT ARM USING 4X7MM X 87 CM STRETCH GORETEX GRAFT;  Surgeon: Rosetta Posner, MD;  [facility];  Service: Vascular;  Laterality: Left;   BREAST LUMPECTOMY Left    COLONOSCOPY W/ POLYPECTOMY     EYE SURGERY Bilateral    Cataract   FISTULA SUPERFICIALIZATION Left 06/01/2018   Procedure: FISTULA PLICATION  LEFT ARM;  Surgeon: Serafina Mitchell, MD;  Location: [facility];  Service: Vascular;  Laterality: Left;   INSERTION OF DIALYSIS CATHETER Left 03/27/2020   Procedure: INSERTION OF TUNNELED DIALYSIS CATHETER USING PALINDROME 23CM;  Surgeon: Rosetta Posner, MD;  Location: [facility];  Service: Vascular;  Laterality: Left;   PACEMAKER PLACEMENT  03/21/2015   [employer]   REVISON OF ARTERIOVENOUS FISTULA Left 09/14/2016   Procedure: LEFT CEPHALIC TURNDOWN;  Surgeon: Conrad Bethel, MD;  Location: [facility];  Service: Vascular;  Laterality: Left;    Short Social History:  Social History   Tobacco Use   Smoking status: Former    Years: 20.00    Types: Cigarettes    Quit date: 09/09/1986    Years since quitting: 35.5   Smokeless tobacco: Never  Substance Use Topics   Alcohol use: No    Allergies  Allergen Reactions   Penicillins Hives    Has patient had a PCN reaction causing immediate rash, facial/tongue/throat swelling, SOB or lightheadedness with hypotension: No Has patient had a PCN reaction causing severe rash involving mucus membranes or skin necrosis: No Has patient had a PCN reaction that required hospitalization: No Has patient had a PCN reaction occurring within  the last 10 years: Yes If all of the above answers are "NO", then may proceed with Cephalosporin use.    Oxycodone Nausea And Vomiting    Current Outpatient Medications  Medication Sig Dispense Refill   amiodarone (PACERONE) 200 MG tablet Take 200 mg by mouth daily.     atorvastatin (LIPITOR) 40 MG tablet Take 40 mg by mouth daily.     cinacalcet (SENSIPAR) 30 MG tablet Take 30 mg by mouth daily with breakfast.      ELIQUIS 5 MG TABS tablet Take 1 tablet (5 mg total) by mouth 2 (two) times daily.     lidocaine-prilocaine (EMLA) cream Apply 1 application topically See admin instructions. Apply 1 to 2 hours before dialysis  3   midodrine (PROAMATINE) 5 MG tablet Take 5 mg by mouth See admin instructions. Take 5 mg by twice daily  on Tuesday, Thursday and Saturday  2   sucroferric oxyhydroxide (VELPHORO) 500 MG chewable tablet Chew 1,000 mg by mouth 3 (three) times daily with meals.      traMADol (ULTRAM) 50 MG tablet Take 1 tablet (50 mg total) by mouth every 6 (six) hours as needed. 20 tablet 0   metoprolol succinate (TOPROL-XL) 25 MG 24 hr tablet Take 12.5 mg by mouth daily.     No current facility-administered medications for this visit.    Review of Systems  Constitutional:  Constitutional negative. HENT: HENT negative.  Eyes: Eyes negative.  Respiratory: Respiratory negative.  Cardiovascular: Cardiovascular negative.  GI: Gastrointestinal negative.  Musculoskeletal: Musculoskeletal negative.  Skin: Skin negative.  Neurological: Neurological negative. Hematologic: Hematologic/lymphatic negative.  Psychiatric: Psychiatric negative.        Objective:  Objective   Vitals:   03/16/22 1211  BP: 135/87  Pulse: 69  Resp: 20  Temp: 98 F (36.7 C)  SpO2: 96%  Weight: 180 lb (81.6 kg)  Height: '5\' 3"'$  (1.6 m)   Body mass index is 31.89 kg/m.  Physical Exam HENT:     Head: Normocephalic.     Nose: Nose normal.  Eyes:     Pupils: Pupils are equal, round, and reactive to light.  Cardiovascular:     Rate and Rhythm: Normal rate.  Pulmonary:     Effort: Pulmonary effort is normal.  Abdominal:     General: Abdomen is flat.     Palpations: Abdomen is soft.  Musculoskeletal:        General: Normal range of motion.     Cervical back: Normal range of motion and neck supple.     Right lower leg: No edema.     Left lower leg: No edema.  Skin:    General: Skin is warm and dry.     Capillary Refill: Capillary refill takes less than 2 seconds.  Neurological:     General: No focal deficit present.     Mental Status: She is alert.  Psychiatric:        Mood and Affect: Mood normal.        Behavior: Behavior normal.        Thought Content: Thought content normal.    Data: Right Cephalic    Diameter (cm)Depth (cm)Findings  +-----------------+-------------+----------+--------+  Shoulder                         0.27             +-----------------+-------------+----------+--------+  Prox upper arm  0.29             +-----------------+-------------+----------+--------+  Mid upper arm                    0.27             +-----------------+-------------+----------+--------+  Dist upper arm                   0.27             +-----------------+-------------+----------+--------+  Antecubital fossa                0.28             +-----------------+-------------+----------+--------+  Prox forearm                     0.23             +-----------------+-------------+----------+--------+  Mid forearm                      0.25             +-----------------+-------------+----------+--------+  Dist forearm                     0.18             +-----------------+-------------+----------+--------+   +-----------------+-------------+----------+--------+  Right Basilic    Diameter (cm)Depth (cm)Findings  +-----------------+-------------+----------+--------+  Prox upper arm                   0.41             +-----------------+-------------+----------+--------+  Mid upper arm                    0.31             +-----------------+-------------+----------+--------+  Dist upper arm                   0.25             +-----------------+-------------+----------+--------+  Antecubital fossa                0.30             +-----------------+-------------+----------+--------+  Prox forearm                     0.29             +-----------------+-------------+----------+--------+   +-----------------+-------------+----------+------------+  Left Cephalic    Diameter (cm)Depth (cm)  Findings    +-----------------+-------------+----------+------------+  Shoulder             0.53                              +-----------------+-------------+----------+------------+  Prox upper arm       0.41               Compressible  +-----------------+-------------+----------+------------+  Dist upper arm                           Thrombosed   +-----------------+-------------+----------+------------+  Antecubital fossa                       Previous AVF  +-----------------+-------------+----------+------------+   +-----------------+-------------+----------+--------+  Left Basilic     Diameter (cm)Depth (cm)Findings  +-----------------+-------------+----------+--------+  Prox upper arm  0.43                         +-----------------+-------------+----------+--------+  Mid upper arm        0.39                         +-----------------+-------------+----------+--------+  Dist upper arm       0.31                         +-----------------+-------------+----------+--------+  Antecubital fossa    0.27                         +-----------------+-------------+----------+--------+  Prox forearm         0.23                         +-----------------+-------------+----------+--------+   Summary: Right: Patent cephalic and basilic veins with diameters as  described         above/.  Left: Left cephalic vein is part of a non functioning AVF and is        partially thrombosed.   Right Pre-Dialysis Findings:  +-----------------------+----------+--------------------+--------+--------+   Location               PSV (cm/s)Intralum. Diam.  (cm)WaveformComments  +-----------------------+----------+--------------------+--------+--------+   Brachial Antecub. fossa68        0.40                biphasic            +-----------------------+----------+--------------------+--------+--------+   Radial Art at Wrist    72        0.20                biphasic             +-----------------------+----------+--------------------+--------+--------+   Ulnar Art at Wrist     57        0.12                biphasic            +-----------------------+----------+--------------------+--------+--------+      Left Pre-Dialysis Findings:  +-----------------------+----------+--------------------+--------+--------+   Location               PSV (cm/s)Intralum. Diam.  (cm)WaveformComments  +-----------------------+----------+--------------------+--------+--------+   Brachial Antecub. fossa42        0.42                biphasic            +-----------------------+----------+--------------------+--------+--------+   Radial Art at Wrist    50        0.15                biphasic            +-----------------------+----------+--------------------+--------+--------+   Ulnar Art at Wrist     0         0.11                absent              +-----------------------+----------+--------------------+--------+--------+   Summary:     Right: No obstruction visualized in the right upper extremity.  Left: Total occlusion visualized in the left upper extremity.        Obstruction noted in the ulnar artery.      Assessment/Plan:    64  female with history of left arm access procedure for dialysis.  She continues on dialysis via catheter.  She has possible suitable basilic vein on the right for fistula creation which we discussed would be 2 procedures or otherwise she would need graft.  We will get her scheduled on a nondialysis day in the near future.  She will hold Eliquis 48 hours prior to the procedure. We discussed the risk benefits alternatives she demonstrates good understanding     Waynetta Sandy MD [facility]

## 2022-03-16 NOTE — Progress Notes (Signed)
Patient ID: Stacy Hammond, female   DOB: 09/20/1946, 76 y.o.   MRN: [id number]  Reason for Consult: Follow-up   Referred by Justin Mend, MD  Subjective:     HPI:  Stacy Hammond is a 76 y.o. female history of multiple previous left-sided upper arm AV access procedures.  She continues on dialysis on Tuesdays Thursdays and Saturdays.  She is on Eliquis.  Most recently thrombosed the graft in the left upper arm now on dialysis via catheter.  Not having any issues with her left-sided catheter at this time.  Denies any previous right arm access procedures or other surgeries.  She is right-hand dominant.  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 No pertinent family history. Past Surgical History:  Procedure Laterality Date   ABDOMINAL HYSTERECTOMY     partial   ANKLE FUSION Right 2014?   done at [facility] Left 03/27/2020   Procedure: INSERTION OF LEFT UPPER ARM ARTERIOVENOUS (AV) GORE-TEX GRAFT ARM USING 4X7MM X 63 CM STRETCH GORETEX GRAFT;  Surgeon: Rosetta Posner, MD;  [facility];  Service: Vascular;  Laterality: Left;   BREAST LUMPECTOMY Left    COLONOSCOPY W/ POLYPECTOMY     EYE SURGERY Bilateral    Cataract   FISTULA SUPERFICIALIZATION Left 06/01/2018   Procedure: FISTULA PLICATION  LEFT ARM;  Surgeon: Serafina Mitchell, MD;  [facility];  Service: Vascular;  Laterality: Left;   INSERTION OF DIALYSIS CATHETER Left 03/27/2020   Procedure: INSERTION OF TUNNELED DIALYSIS CATHETER USING PALINDROME 23CM;  Surgeon: Rosetta Posner, MD;  Location: [facility];  Service: Vascular;  Laterality: Left;   PACEMAKER PLACEMENT  03/21/2015   [employer]   REVISON OF ARTERIOVENOUS FISTULA Left 09/14/2016   Procedure: LEFT CEPHALIC TURNDOWN;  Surgeon: Conrad [place], MD;  Location: [facility];  Service: Vascular;  Laterality: Left;    Short Social History:  Social History   Tobacco Use   Smoking status: Former    Years: 20.00    Types: Cigarettes    Quit date: 09/09/1986    Years since quitting: 35.5   Smokeless tobacco: Never  Substance Use Topics   Alcohol use: No    Allergies  Allergen Reactions   Penicillins Hives    Has patient had a PCN reaction causing immediate rash, facial/tongue/throat swelling, SOB or lightheadedness with hypotension: No Has patient had a PCN reaction causing severe rash involving mucus membranes or skin necrosis: No Has patient had a PCN reaction that required hospitalization: No Has patient had a PCN reaction occurring within  the last 10 years: Yes If all of the above answers are "NO", then may proceed with Cephalosporin use.    Oxycodone Nausea And Vomiting    Current Outpatient Medications  Medication Sig Dispense Refill   amiodarone (PACERONE) 200 MG tablet Take 200 mg by mouth daily.     atorvastatin (LIPITOR) 40 MG tablet Take 40 mg by mouth daily.     cinacalcet (SENSIPAR) 30 MG tablet Take 30 mg by mouth daily with breakfast.      ELIQUIS 5 MG TABS tablet Take 1 tablet (5 mg total) by mouth 2 (two) times daily.     lidocaine-prilocaine (EMLA) cream Apply 1 application topically See admin instructions. Apply 1 to 2 hours before dialysis  3   midodrine (PROAMATINE) 5 MG tablet Take 5 mg by mouth See admin instructions. Take 5 mg by twice daily  on Tuesday, Thursday and Saturday  2   sucroferric oxyhydroxide (VELPHORO) 500 MG chewable tablet Chew 1,000 mg by mouth 3 (three) times daily with meals.      traMADol (ULTRAM) 50 MG tablet Take 1 tablet (50 mg total) by mouth every 6 (six) hours as needed. 20 tablet 0   metoprolol succinate (TOPROL-XL) 25 MG 24 hr tablet Take 12.5 mg by mouth daily.     No current facility-administered medications for this visit.    Review of Systems  Constitutional:  Constitutional negative. HENT: HENT negative.  Eyes: Eyes negative.  Respiratory: Respiratory negative.  Cardiovascular: Cardiovascular negative.  GI: Gastrointestinal negative.  Musculoskeletal: Musculoskeletal negative.  Skin: Skin negative.  Neurological: Neurological negative. Hematologic: Hematologic/lymphatic negative.  Psychiatric: Psychiatric negative.        Objective:  Objective   Vitals:   03/16/22 1211  BP: 135/87  Pulse: 69  Resp: 20  Temp: 98 F (36.7 C)  SpO2: 96%  Weight: 180 lb (81.6 kg)  Height: '5\' 3"'$  (1.6 m)   Body mass index is 31.89 kg/m.  Physical Exam HENT:     Head: Normocephalic.     Nose: Nose normal.  Eyes:     Pupils: Pupils are equal, round, and reactive to light.  Cardiovascular:     Rate and Rhythm: Normal rate.  Pulmonary:     Effort: Pulmonary effort is normal.  Abdominal:     General: Abdomen is flat.     Palpations: Abdomen is soft.  Musculoskeletal:        General: Normal range of motion.     Cervical back: Normal range of motion and neck supple.     Right lower leg: No edema.     Left lower leg: No edema.  Skin:    General: Skin is warm and dry.     Capillary Refill: Capillary refill takes less than 2 seconds.  Neurological:     General: No focal deficit present.     Mental Status: She is alert.  Psychiatric:        Mood and Affect: Mood normal.        Behavior: Behavior normal.        Thought Content: Thought content normal.    Data: Right Cephalic    Diameter (cm)Depth (cm)Findings  +-----------------+-------------+----------+--------+  Shoulder                         0.27             +-----------------+-------------+----------+--------+  Prox upper arm  0.29             +-----------------+-------------+----------+--------+  Mid upper arm                    0.27             +-----------------+-------------+----------+--------+  Dist upper arm                   0.27             +-----------------+-------------+----------+--------+  Antecubital fossa                0.28             +-----------------+-------------+----------+--------+  Prox forearm                     0.23             +-----------------+-------------+----------+--------+  Mid forearm                      0.25             +-----------------+-------------+----------+--------+  Dist forearm                     0.18             +-----------------+-------------+----------+--------+   +-----------------+-------------+----------+--------+  Right Basilic    Diameter (cm)Depth (cm)Findings  +-----------------+-------------+----------+--------+  Prox upper arm                   0.41             +-----------------+-------------+----------+--------+  Mid upper arm                    0.31             +-----------------+-------------+----------+--------+  Dist upper arm                   0.25             +-----------------+-------------+----------+--------+  Antecubital fossa                0.30             +-----------------+-------------+----------+--------+  Prox forearm                     0.29             +-----------------+-------------+----------+--------+   +-----------------+-------------+----------+------------+  Left Cephalic    Diameter (cm)Depth (cm)  Findings    +-----------------+-------------+----------+------------+  Shoulder             0.53                              +-----------------+-------------+----------+------------+  Prox upper arm       0.41               Compressible  +-----------------+-------------+----------+------------+  Dist upper arm                           Thrombosed   +-----------------+-------------+----------+------------+  Antecubital fossa                       Previous AVF  +-----------------+-------------+----------+------------+   +-----------------+-------------+----------+--------+  Left Basilic     Diameter (cm)Depth (cm)Findings  +-----------------+-------------+----------+--------+  Prox upper arm  0.43                         +-----------------+-------------+----------+--------+  Mid upper arm        0.39                         +-----------------+-------------+----------+--------+  Dist upper arm       0.31                         +-----------------+-------------+----------+--------+  Antecubital fossa    0.27                         +-----------------+-------------+----------+--------+  Prox forearm         0.23                         +-----------------+-------------+----------+--------+   Summary: Right: Patent cephalic and basilic veins with diameters as  described         above/.  Left: Left cephalic vein is part of a non functioning AVF and is        partially thrombosed.   Right Pre-Dialysis Findings:  +-----------------------+----------+--------------------+--------+--------+   Location               PSV (cm/s)Intralum. Diam.  (cm)WaveformComments  +-----------------------+----------+--------------------+--------+--------+   Brachial Antecub. fossa68        0.40                biphasic            +-----------------------+----------+--------------------+--------+--------+   Radial Art at Wrist    72        0.20                biphasic             +-----------------------+----------+--------------------+--------+--------+   Ulnar Art at Wrist     57        0.12                biphasic            +-----------------------+----------+--------------------+--------+--------+      Left Pre-Dialysis Findings:  +-----------------------+----------+--------------------+--------+--------+   Location               PSV (cm/s)Intralum. Diam.  (cm)WaveformComments  +-----------------------+----------+--------------------+--------+--------+   Brachial Antecub. fossa42        0.42                biphasic            +-----------------------+----------+--------------------+--------+--------+   Radial Art at Wrist    50        0.15                biphasic            +-----------------------+----------+--------------------+--------+--------+   Ulnar Art at Wrist     0         0.11                absent              +-----------------------+----------+--------------------+--------+--------+   Summary:     Right: No obstruction visualized in the right upper extremity.  Left: Total occlusion visualized in the left upper extremity.        Obstruction noted in the ulnar artery.      Assessment/Plan:    62  female with history of left arm access procedure for dialysis.  She continues on dialysis via catheter.  She has possible suitable basilic vein on the right for fistula creation which we discussed would be 2 procedures or otherwise she would need graft.  We will get her scheduled on a nondialysis day in the near future.  She will hold Eliquis 48 hours prior to the procedure. We discussed the risk benefits alternatives she demonstrates good understanding     Waynetta Sandy MD [facility]

## 2022-03-21 ENCOUNTER — Other Ambulatory Visit: Payer: Self-pay

## 2022-03-21 DIAGNOSIS — Z992 Dependence on renal dialysis: Secondary | ICD-10-CM

## 2022-03-31 ENCOUNTER — Encounter (HOSPITAL_COMMUNITY): Payer: Self-pay | Admitting: Vascular Surgery

## 2022-03-31 ENCOUNTER — Other Ambulatory Visit: Payer: Self-pay

## 2022-03-31 NOTE — Anesthesia Preprocedure Evaluation (Addendum)
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)      Neuro/Psych  Neuromuscular disease CVA, No Residual Symptoms negative psych ROS   GI/Hepatic   Endo/Other  diabetes  Renal/GU ESRF and DialysisRenal diseaseOn HD T, R, Sat     Musculoskeletal  (+) Arthritis ,   Abdominal (+) + obese,   Peds  Hematology   Anesthesia Other Findings ESRD  Reproductive/Obstetrics                          Anesthesia Physical Anesthesia Plan  ASA: 3  Anesthesia Plan: General   Post-op Pain Management:    Induction: Intravenous  PONV Risk Score and Plan: 3 and Ondansetron, Dexamethasone and Treatment may vary due to age or medical condition  Airway Management Planned: LMA  Additional Equipment:   Intra-op Plan:   Post-operative Plan: Extubation in OR  Informed Consent: I have reviewed the patients History and Physical, chart, labs and discussed the procedure including the risks, benefits and alternatives for the proposed anesthesia with the patient or authorized representative who has indicated his/her understanding and acceptance.       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  Cath 07/2018 showed diffuse nonobstructive  coronary artery disease.  Echo 02/2020 showed EF 65%, grade 2 DD, no significant valvular abnormalities.  Last in person device interrogation 11/06/2021. Per note, "Pacemaker/ICD checked by device nurse. I have reviewed and agree with the findings. Normal pacemaker function. A pace - <1%. V pace - 96%. Events - Atach, 25 seconds long. Battery - 5 years. Underlying Rhythm - AF w/ slow ventricular response. Should be considered pacemaker dependent. Continue remotes."  Periop device form has been sent to EP clinic.   Hx of Left breast cancers/p left segmental mastectomy 02/16/15.  DM2, diet controlled.   ESRD on HD Tuesday Thursday Saturday. Most recently thrombosed the graft in the left upper arm now on dialysis via left sided catheter. Per Dr. Claretha Cooper note, pt to hold Eliquis 48hr prior to procedure.   Will need DOS labs and evaluation.  Echo 02/12/20(Care everywhere): Summary  1. The left ventricle is normal in size with moderately increased wall  thickness.  2. The left ventricular systolic function is normal, LVEF is visually  estimated at 60-65%.  3. There is grade II diastolic dysfunction (elevated filling pressure).  4. Mitral annular calcification is present (severe).  5. The left atrium is mildly to moderately dilated in size.  6. The aortic valve is probably trileaflet with mildly thickened leaflets  with probably normal excursion.  7. The right ventricle is normal in size, with normalsystolic function.  8. There is mild tricuspid regurgitation.  9. There is borderline pulmonary hypertension, estimated pulmonary artery  systolic pressure is 32 mmHg.   Cardiac cath 07/24/18(Care everywhere):  Findings:  1. Diffuse, non-obstructive coronary artery disease  2. Heavily calcified coronary arteries and aorta as well as airway  calcification  3. Normal left ventricular filling pressures (LVEDP = 11 mm Hg).  4. Preserved left ventricular systolic function  (estimated LVEF = 50 %)  with focal area of distal inferior wall hypokinesis  5. No angiographic evidence of significant proximal renal artery stenosis  Recommendations:  1. Aggressive secondary prevention.  2. Followup with primary cardiologist.  3. Recommend formal echocardiogram   )      Anesthesia Quick Evaluation

## 2022-03-31 NOTE — Progress Notes (Signed)
Anesthesia Chart Review: Same day workup  Follows with cardiology at [facility] for hx of HTN, HLD, CHF, dysrhythmia (PAF, NSVT), heart block (s/p [place] [employer] PPM 2016 - Pacemaker dependent), DVT, CVA, PAD, ESRD,  OSA (intolerant to CPAP).  Cath 07/2018 showed diffuse nonobstructive coronary artery disease.  Echo 02/2020 showed EF 65%, grade 2 DD, no significant valvular abnormalities.  Last in person device interrogation 11/06/2021. Per note, "Pacemaker/ICD checked by device nurse. I have reviewed and agree with the findings. Normal pacemaker function. A pace - <1%. V pace - 96%. Events - Atach, 25 seconds long. Battery - 5 years. Underlying Rhythm - AF w/ slow ventricular response. Should be considered pacemaker dependent. Continue remotes."   Periop device form has been sent to EP clinic.   Hx of Left breast cancer s/p left segmental mastectomy 02/16/15.  DM2, diet controlled.   ESRD on HD Tuesday Thursday Saturday. Most recently thrombosed the graft in the left upper arm now on dialysis via left sided catheter.  Per Dr. Claretha Cooper note, pt to hold Eliquis 48hr prior to procedure.   Will need DOS labs and evaluation.  Echo 02/12/20 (Care everywhere): Summary    1. The left ventricle is normal in size with moderately increased wall  thickness.    2. The left ventricular systolic function is normal, LVEF is visually  estimated at 60-65%.    3. There is grade II diastolic dysfunction (elevated filling pressure).    4. Mitral annular calcification is present (severe).    5. The left atrium is mildly to moderately dilated in size.    6. The aortic valve is probably trileaflet with mildly thickened leaflets  with probably normal excursion.    7. The right ventricle is normal in size, with normal systolic function.    8. There is mild tricuspid regurgitation.    9. There is borderline pulmonary hypertension, estimated pulmonary artery  systolic pressure is 32 mmHg.     Cardiac cath 07/24/18 (Care  everywhere): Findings:  1. Diffuse, non-obstructive coronary artery disease  2. Heavily calcified coronary arteries and aorta as well as airway  calcification  3. Normal left ventricular filling pressures (LVEDP = 11 mm Hg).  4. Preserved left ventricular systolic function (estimated LVEF = 50 %)  with focal area of distal inferior wall hypokinesis  5. No angiographic evidence of significant proximal renal artery stenosis  Recommendations:  1. Aggressive secondary prevention.  2. Follow up with primary cardiologist.  3. Recommend formal echocardiogram     Wynonia Musty [facility] Short Stay Center/Anesthesiology Phone [phone] 03/31/2022 4:18 PM

## 2022-03-31 NOTE — Progress Notes (Signed)
PCP - Dr. Lou Miner Cardiologist - Dr. Angela Burke  PPM/ICD - [facility] Scientific Dual Chamber Pacemaker  Device Orders - faxed to [place] Notified - Spoke with Roderic Palau with [employer]. He says her surgery would just require a magnet, no rep needed and he would talk to anesthesia if needed.  Chest x-ray - 03/27/20 EKG - 03/27/20- needs DOS Stress Test - 09/2015 ECHO - 02/12/20 Cardiac Cath - 07/2018  CPAP - no, pt does not tolerate  DM- denies  Blood Thinner Instructions: Eliquis- hold for 3 days Aspirin Instructions: n/a  ERAS Protcol - no, NPO  COVID TEST- n/a  Anesthesia review: yes, cardiac hx, pacemaker  Patient verbally denies any shortness of breath, fever, cough and chest pain during phone call   -------------  SDW INSTRUCTIONS given:  Your procedure is scheduled on 6/30.  Report to [facility] Main Entrance "A" at 6:50 A.M., and check in at the Admitting office.  Call this number if you have problems the morning of surgery:  [phone]   Remember:  Do not eat or drink after midnight the night before your surgery     Take these medicines the morning of surgery with A SIP OF WATER  amiodarone (PACERONE)  atorvastatin (LIPITOR)  cinacalcet (SENSIPAR)     As of today, STOP taking any Aspirin (unless otherwise instructed by your surgeon) Aleve, Naproxen, Ibuprofen, Motrin, Advil, Goody's, BC's, all herbal medications, fish oil, and all vitamins.                      Do not wear jewelry, make up, or nail polish            Do not wear lotions, powders, perfumes, or deodorant.            Do not shave 48 hours prior to surgery.              Do not bring valuables to the hospital.            [facility] is not responsible for any belongings or valuables.  Do NOT Smoke (Tobacco/Vaping) 24 hours prior to your procedure If you use a CPAP at night, you may bring all equipment for your overnight stay.   Contacts, glasses, dentures or  bridgework may not be worn into surgery.      For patients admitted to the hospital, discharge time will be determined by your treatment team.   Patients discharged the day of surgery will not be allowed to drive home, and someone needs to stay with them for 24 hours.    Special instructions:   [place]- Preparing For Surgery  Before surgery, you can play an important role. Because skin is not sterile, your skin needs to be as free of germs as possible. You can reduce the number of germs on your skin by washing with CHG (chlorahexidine gluconate) Soap before surgery.  CHG is an antiseptic cleaner which kills germs and bonds with the skin to continue killing germs even after washing.    Oral Hygiene is also important to reduce your risk of infection.  Remember - BRUSH YOUR TEETH THE MORNING OF SURGERY WITH YOUR REGULAR TOOTHPASTE  Please do not use if you have an allergy to CHG or antibacterial soaps. If your skin becomes reddened/irritated stop using the CHG.  Do not shave (including legs and underarms) for at least 48 hours prior to first CHG shower. It is OK to shave  your face.  Please follow these instructions carefully.   Shower the NIGHT BEFORE SURGERY and the MORNING OF SURGERY with DIAL Soap.   Pat yourself dry with a CLEAN TOWEL.  Wear CLEAN PAJAMAS to bed the night before surgery  Place CLEAN SHEETS on your bed the night of your first shower and DO NOT SLEEP WITH PETS.   Day of Surgery: Please shower morning of surgery  Wear Clean/Comfortable clothing the morning of surgery Do not apply any deodorants/lotions.   Remember to brush your teeth WITH YOUR REGULAR TOOTHPASTE.   Questions were answered. Patient verbalized understanding of instructions.

## 2022-04-01 ENCOUNTER — Ambulatory Visit (HOSPITAL_BASED_OUTPATIENT_CLINIC_OR_DEPARTMENT_OTHER): Payer: Medicare Other | Admitting: Physician Assistant

## 2022-04-01 ENCOUNTER — Ambulatory Visit (HOSPITAL_COMMUNITY): Payer: Medicare Other | Admitting: Physician Assistant

## 2022-04-01 ENCOUNTER — Encounter (HOSPITAL_COMMUNITY)
Admission: RE | Disposition: A | Discharge status: Home / Self Care | Payer: Self-pay | Source: Home / Self Care | Attending: Vascular Surgery

## 2022-04-01 ENCOUNTER — Ambulatory Visit (HOSPITAL_COMMUNITY)
Admission: RE | Admit: 2022-04-01 | Discharge: 2022-04-01 | Disposition: A | Discharge status: Home / Self Care | Payer: Medicare Other | Attending: Vascular Surgery | Admitting: Vascular Surgery

## 2022-04-01 ENCOUNTER — Other Ambulatory Visit: Payer: Self-pay

## 2022-04-01 DIAGNOSIS — E1122 Type 2 diabetes mellitus with diabetic chronic kidney disease: Secondary | ICD-10-CM | POA: Insufficient documentation

## 2022-04-01 DIAGNOSIS — Z950 Presence of cardiac pacemaker: Secondary | ICD-10-CM | POA: Diagnosis not present

## 2022-04-01 DIAGNOSIS — T82868A Thrombosis of vascular prosthetic devices, implants and grafts, initial encounter: Secondary | ICD-10-CM | POA: Diagnosis not present

## 2022-04-01 DIAGNOSIS — X58XXXA Exposure to other specified factors, initial encounter: Secondary | ICD-10-CM | POA: Insufficient documentation

## 2022-04-01 DIAGNOSIS — N186 End stage renal disease: Secondary | ICD-10-CM

## 2022-04-01 DIAGNOSIS — G4730 Sleep apnea, unspecified: Secondary | ICD-10-CM | POA: Insufficient documentation

## 2022-04-01 DIAGNOSIS — E1151 Type 2 diabetes mellitus with diabetic peripheral angiopathy without gangrene: Secondary | ICD-10-CM | POA: Insufficient documentation

## 2022-04-01 DIAGNOSIS — I509 Heart failure, unspecified: Secondary | ICD-10-CM | POA: Diagnosis not present

## 2022-04-01 DIAGNOSIS — I132 Hypertensive heart and chronic kidney disease with heart failure and with stage 5 chronic kidney disease, or end stage renal disease: Secondary | ICD-10-CM

## 2022-04-01 DIAGNOSIS — Z992 Dependence on renal dialysis: Secondary | ICD-10-CM | POA: Diagnosis not present

## 2022-04-01 DIAGNOSIS — Z7901 Long term (current) use of anticoagulants: Secondary | ICD-10-CM | POA: Insufficient documentation

## 2022-04-01 DIAGNOSIS — Z6831 Body mass index (BMI) 31.0-31.9, adult: Secondary | ICD-10-CM | POA: Diagnosis not present

## 2022-04-01 DIAGNOSIS — Y832 Surgical operation with anastomosis, bypass or graft as the cause of abnormal reaction of the patient, or of later complication, without mention of misadventure at the time of the procedure: Secondary | ICD-10-CM | POA: Insufficient documentation

## 2022-04-01 DIAGNOSIS — N185 Chronic kidney disease, stage 5: Secondary | ICD-10-CM | POA: Diagnosis not present

## 2022-04-01 DIAGNOSIS — E669 Obesity, unspecified: Secondary | ICD-10-CM | POA: Diagnosis not present

## 2022-04-01 DIAGNOSIS — Z8673 Personal history of transient ischemic attack (TIA), and cerebral infarction without residual deficits: Secondary | ICD-10-CM | POA: Diagnosis not present

## 2022-04-01 DIAGNOSIS — Z87891 Personal history of nicotine dependence: Secondary | ICD-10-CM | POA: Diagnosis not present

## 2022-04-01 HISTORY — PX: AV FISTULA PLACEMENT: SHX1204

## 2022-04-01 LAB — POCT I-STAT, CHEM 8
BUN: 31 mg/dL — ABNORMAL HIGH (ref 8–23)
Calcium, Ion: 1.15 mmol/L (ref 1.15–1.40)
Chloride: 101 mmol/L (ref 98–111)
Creatinine, Ser: 8.3 mg/dL — ABNORMAL HIGH (ref 0.44–1.00)
Glucose, Bld: 136 mg/dL — ABNORMAL HIGH (ref 70–99)
HCT: 48 % — ABNORMAL HIGH (ref 36.0–46.0)
Hemoglobin: 16.3 g/dL — ABNORMAL HIGH (ref 12.0–15.0)
Potassium: 3.9 mmol/L (ref 3.5–5.1)
Sodium: 138 mmol/L (ref 135–145)
TCO2: 26 mmol/L (ref 22–32)

## 2022-04-01 LAB — GLUCOSE, CAPILLARY
Glucose-Capillary: 120 mg/dL — ABNORMAL HIGH (ref 70–99)
Glucose-Capillary: 155 mg/dL — ABNORMAL HIGH (ref 70–99)

## 2022-04-01 SURGERY — ARTERIOVENOUS (AV) FISTULA CREATION
Anesthesia: General | Site: Arm Upper | Laterality: Right

## 2022-04-01 MED ORDER — PHENYLEPHRINE HCL-NACL 20-0.9 MG/250ML-% IV SOLN
INTRAVENOUS | Status: DC | PRN
  Administered 2022-04-01: 40 ug/min via INTRAVENOUS

## 2022-04-01 MED ORDER — SODIUM CHLORIDE 0.9 % IV SOLN: INTRAVENOUS | Status: DC

## 2022-04-01 MED ORDER — ONDANSETRON HCL 4 MG/2ML IJ SOLN: 4.0000 mg | Freq: Once | INTRAMUSCULAR | Status: DC | PRN

## 2022-04-01 MED ORDER — DEXAMETHASONE SODIUM PHOSPHATE 10 MG/ML IJ SOLN
INTRAMUSCULAR | Status: DC | PRN
  Administered 2022-04-01: 10 mg via INTRAVENOUS

## 2022-04-01 MED ORDER — ACETAMINOPHEN 325 MG PO TABS: 650.0000 mg | ORAL_TABLET | Freq: Four times a day (QID) | ORAL | Status: DC | PRN

## 2022-04-01 MED ORDER — PROPOFOL 10 MG/ML IV BOLUS
INTRAVENOUS | Status: DC | PRN
  Administered 2022-04-01: 150 mg via INTRAVENOUS
  Administered 2022-04-01: 50 mg via INTRAVENOUS

## 2022-04-01 MED ORDER — INSULIN ASPART 100 UNIT/ML IJ SOLN: 0.0000 [IU] | INTRAMUSCULAR | Status: DC | PRN

## 2022-04-01 MED ORDER — HEPARIN 6000 UNIT IRRIGATION SOLUTION
Status: DC | PRN
  Administered 2022-04-01: 1

## 2022-04-01 MED ORDER — FENTANYL CITRATE (PF) 250 MCG/5ML IJ SOLN
INTRAMUSCULAR | Status: AC
  Filled 2022-04-01: qty 5

## 2022-04-01 MED ORDER — CHLORHEXIDINE GLUCONATE 4 % EX LIQD: 60.0000 mL | Freq: Once | CUTANEOUS | Status: DC

## 2022-04-01 MED ORDER — TRAMADOL HCL 50 MG PO TABS: 50.0000 mg | ORAL_TABLET | Freq: Four times a day (QID) | ORAL | 0 refills | Status: DC | PRN

## 2022-04-01 MED ORDER — EPHEDRINE SULFATE-NACL 50-0.9 MG/10ML-% IV SOSY
PREFILLED_SYRINGE | INTRAVENOUS | Status: DC | PRN
  Administered 2022-04-01: 15 mg via INTRAVENOUS

## 2022-04-01 MED ORDER — FENTANYL CITRATE (PF) 100 MCG/2ML IJ SOLN: 25.0000 ug | INTRAMUSCULAR | Status: DC | PRN

## 2022-04-01 MED ORDER — CHLORHEXIDINE GLUCONATE 0.12 % MT SOLN
15.0000 mL | Freq: Once | OROMUCOSAL | Status: AC
  Administered 2022-04-01: 15 mL via OROMUCOSAL
  Filled 2022-04-01: qty 15

## 2022-04-01 MED ORDER — VANCOMYCIN HCL IN DEXTROSE 1-5 GM/200ML-% IV SOLN
1000.0000 mg | INTRAVENOUS | Status: AC
  Administered 2022-04-01: 1000 mg via INTRAVENOUS
  Filled 2022-04-01: qty 200

## 2022-04-01 MED ORDER — HEPARIN 6000 UNIT IRRIGATION SOLUTION
Status: AC
  Filled 2022-04-01: qty 500

## 2022-04-01 MED ORDER — LIDOCAINE 2% (20 MG/ML) 5 ML SYRINGE
INTRAMUSCULAR | Status: DC | PRN
  Administered 2022-04-01: 60 mg via INTRAVENOUS

## 2022-04-01 MED ORDER — ONDANSETRON HCL 4 MG/2ML IJ SOLN
INTRAMUSCULAR | Status: DC | PRN
  Administered 2022-04-01: 4 mg via INTRAVENOUS

## 2022-04-01 MED ORDER — 0.9 % SODIUM CHLORIDE (POUR BTL) OPTIME
TOPICAL | Status: DC | PRN
  Administered 2022-04-01: 1000 mL

## 2022-04-01 MED ORDER — FENTANYL CITRATE (PF) 250 MCG/5ML IJ SOLN
INTRAMUSCULAR | Status: DC | PRN
  Administered 2022-04-01 (×3): 50 ug via INTRAVENOUS

## 2022-04-01 MED ORDER — ORAL CARE MOUTH RINSE: 15.0000 mL | Freq: Once | OROMUCOSAL | Status: AC

## 2022-04-01 SURGICAL SUPPLY — 29 items
ADH SKN CLS APL DERMABOND .7 (GAUZE/BANDAGES/DRESSINGS) ×1
ARMBAND PINK RESTRICT EXTREMIT (MISCELLANEOUS) ×2 IMPLANT
BAG COUNTER SPONGE SURGICOUNT (BAG) ×2 IMPLANT
BAG SPNG CNTER NS LX DISP (BAG) ×1
CANISTER SUCT 3000ML PPV (MISCELLANEOUS) ×2 IMPLANT
CLIP LIGATING EXTRA MED SLVR (CLIP) ×2 IMPLANT
CLIP LIGATING EXTRA SM BLUE (MISCELLANEOUS) ×2 IMPLANT
COVER PROBE W GEL 5X96 (DRAPES) ×2 IMPLANT
DERMABOND ADVANCED (GAUZE/BANDAGES/DRESSINGS) ×1
DERMABOND ADVANCED .7 DNX12 (GAUZE/BANDAGES/DRESSINGS) ×1 IMPLANT
ELECT REM PT RETURN 9FT ADLT (ELECTROSURGICAL) ×2
ELECTRODE REM PT RTRN 9FT ADLT (ELECTROSURGICAL) ×1 IMPLANT
GLOVE BIO SURGEON STRL SZ7.5 (GLOVE) ×2 IMPLANT
GOWN STRL REUS W/ TWL LRG LVL3 (GOWN DISPOSABLE) ×2 IMPLANT
GOWN STRL REUS W/ TWL XL LVL3 (GOWN DISPOSABLE) ×1 IMPLANT
GOWN STRL REUS W/TWL LRG LVL3 (GOWN DISPOSABLE) ×4
GOWN STRL REUS W/TWL XL LVL3 (GOWN DISPOSABLE) ×2
KIT BASIN OR (CUSTOM PROCEDURE TRAY) ×2 IMPLANT
KIT TURNOVER KIT B (KITS) ×2 IMPLANT
NS IRRIG 1000ML POUR BTL (IV SOLUTION) ×2 IMPLANT
PACK CV ACCESS (CUSTOM PROCEDURE TRAY) ×2 IMPLANT
PAD ARMBOARD 7.5X6 YLW CONV (MISCELLANEOUS) ×4 IMPLANT
SUT MNCRL AB 4-0 PS2 18 (SUTURE) ×2 IMPLANT
SUT PROLENE 6 0 BV (SUTURE) ×3 IMPLANT
SUT VIC AB 3-0 SH 27 (SUTURE) ×2
SUT VIC AB 3-0 SH 27X BRD (SUTURE) ×1 IMPLANT
TOWEL GREEN STERILE (TOWEL DISPOSABLE) ×2 IMPLANT
UNDERPAD 30X36 HEAVY ABSORB (UNDERPADS AND DIAPERS) ×2 IMPLANT
WATER STERILE IRR 1000ML POUR (IV SOLUTION) ×2 IMPLANT

## 2022-04-01 NOTE — Discharge Instructions (Signed)
[facility]  Discharge Instructions  AV Fistula or Graft Surgery for Dialysis Access  Please refer to the following instructions for your post-procedure care. Your surgeon or physician assistant will discuss any changes with you.  Activity  You may drive the day following your surgery, if you are comfortable and no longer taking prescription pain medication. Resume full activity as the soreness in your incision resolves.  Bathing/Showering  You may shower after you go home. Keep your incision dry for 48 hours. Do not soak in a bathtub, hot tub, or swim until the incision heals completely. You may not shower if you have a hemodialysis catheter.  Incision Care  Clean your incision with mild soap and water after 48 hours. Pat the area dry with a clean towel. You do not need a bandage unless otherwise instructed. Do not apply any ointments or creams to your incision. You may have skin glue on your incision. Do not peel it off. It will come off on its own in about one week. Your arm may swell a bit after surgery. To reduce swelling use pillows to elevate your arm so it is above your heart. Your doctor will tell you if you need to lightly wrap your arm with an ACE bandage.  Diet  Resume your normal diet. There are not special food restrictions following this procedure. In order to heal from your surgery, it is CRITICAL to get adequate nutrition. Your body requires vitamins, minerals, and protein. Vegetables are the best source of vitamins and minerals. Vegetables also provide the perfect balance of protein. Processed food has little nutritional value, so try to avoid this.  Medications  Resume taking all of your medications. If your incision is causing pain, you may take over-the counter pain relievers such as acetaminophen (Tylenol). If you were prescribed a stronger pain medication, please be aware these medications can cause nausea and constipation. Prevent  nausea by taking the medication with a snack or meal. Avoid constipation by drinking plenty of fluids and eating foods with high amount of fiber, such as fruits, vegetables, and grains.  Do not take Tylenol if you are taking prescription pain medications.  Follow up Your surgeon may want to see you in the office following your access surgery. If so, this will be arranged at the time of your surgery.  Please call us immediately for any of the following conditions:  Increased pain, redness, drainage (pus) from your incision site Fever of 101 degrees or higher Severe or worsening pain at your incision site Hand pain or numbness.  Reduce your risk of vascular disease:  Stop smoking. If you would like help, call [facility] at 1-800-QUIT-NOW (262)050-3430) or [place] [facility] [place] your cholesterol Maintain a desired weight Control your diabetes Keep your blood pressure down  Dialysis  It will take several weeks to several months for your new dialysis access to be ready for use. Your surgeon will determine when it is okay to use it. Your nephrologist will continue to direct your dialysis. You can continue to use your Permcath until your new access is ready for use.   04/01/2022 Stacy Hammond [id number] 1946-04-09  Surgeon(s): Waynetta Sandy, MD  Procedure(s): RIGHT ARM ARTERIOVENOUS (AV) FISTULA  CREATION   May stick graft immediately   May stick graft on designated area only:   X Do not stick right AV fistula for 12 weeks    If you have any questions, please call the  office at [phone].

## 2022-04-01 NOTE — Anesthesia Procedure Notes (Signed)
Procedure Name: LMA Insertion Date/Time: 04/01/2022 9:51 AM  Performed by: Minerva Ends, CRNAPre-anesthesia Checklist: Patient identified, Emergency Drugs available, Suction available and Patient being monitored Patient Re-evaluated:Patient Re-evaluated prior to induction Oxygen Delivery Method: Circle system utilized Preoxygenation: Pre-oxygenation with 100% oxygen Induction Type: IV induction Ventilation: Mask ventilation without difficulty LMA: LMA inserted LMA Size: 4.0 Tube type: Oral Number of attempts: 1 Airway Equipment and Method: Stylet and Oral airway Placement Confirmation: breath sounds checked- equal and bilateral Tube secured with: Tape Dental Injury: Teeth and Oropharynx as per pre-operative assessment

## 2022-04-01 NOTE — Progress Notes (Signed)
Stacy Hammond with [employer] called in regards to pacer instructions for today's surgery. Stacy Hammond stated to "use a magnet and pace at 100 if need be. It should have a good battery and a magnet is all you need". Anesthesia made aware.

## 2022-04-01 NOTE — Op Note (Signed)
    Patient name: Stacy Hammond MRN: [id number] DOB: March 27, 1946 Sex: female  04/01/2022 Pre-operative Diagnosis: ESRD Post-operative diagnosis:  Same Surgeon:  Erlene Quan C. Donzetta Matters, MD Assistant: Paulo Fruit, PA Procedure Performed: Right arm brachial artery to basilic vein AV fistula creation  Indications: 76 year old female with end-stage renal disease with multiple failed left upper extremity accesses now on dialysis via catheter.  She is indicated for right arm AV fistula versus graft creation.  She does have a pacemaker on the right side.  Findings: Cephalic vein at the antecubital measured about 4 mm but was much smaller more cephalad.  Basilic vein was large throughout did not join the deep system until high in the upper arm.  The brachial artery measured 3 mm and was free of disease.  After fistula creation above the antecubitum there was a very strong thrill in the fistula confirmed with Doppler and a palpable radial artery pulse at the wrist that did augment with compression both with increased pulsatility and with improved signal.   Procedure:  The patient was identified in the holding area and taken to the operating room where she is placed supine on the operative table and LMA anesthesia was induced.  She was sterilely prepped and draped in the right upper extremity in usual fashion, antibiotics were administered and a timeout was called.  Ultrasound was used to identify a suitable basilic vein in the right upper arm that did not appear to be a suitable cephalic vein.  We also identified a suitable brachial artery above the antecubitum with the above findings we made a transverse incision and dissected down onto the basilic vein first divided branches between clips and ties marked this for orientation.  We then divided through the deep fascia the brachial artery and placed a vessel loop around this.  The vein was then transected distally and tied off spatulated flushed with saline clamped.   The artery was clamped distally and proximally opened longitudinally and flushed with heparinized saline both directions.  The vein was spatulated and sewn into side with 6-0 Prolene suture.  Prior completion without flushing all directions.  Upon completion there was a very strong thrill in the fistula which was palpable and there was a weakly palpable radial artery pulse at the right wrist.  This was confirmed with Doppler and did augment with compression of the fistula as well.  Satisfied with this we irrigated the wound and closed in layers with Vicryl and Monocryl and Dermabond was placed at the skin level.  She was then awakened from anesthesia having tolerated procedure well without immediate complication.  All counts were correct at completion.   Given the complexity of the case,  the assistant was necessary in order to expedient the procedure and safely perform the technical aspects of the operation.  The assistant provided traction and countertraction to assist with exposure of the artery and vein.  They also assisted with suture ligation of multiple venous branches.  They played a critical role in the anastomosis. These skills, especially following the Prolene suture for the anastomosis, could not have been adequately performed by a scrub tech assistant.   EBL: 20 cc   Stacy Hammond C. Donzetta Matters, MD [facility] [place] Office: [phone] Pager: [phone]

## 2022-04-01 NOTE — Transfer of Care (Signed)
Immediate Anesthesia Transfer of Care Note  Patient: MATRACA HUNKINS  Procedure(s) Performed: RIGHT ARM ARTERIOVENOUS (AV) FISTULA  CREATION (Right: Arm Upper)  Patient Location: PACU  Anesthesia Type:General  Level of Consciousness: awake, alert  and oriented  Airway & Oxygen Therapy: Patient Spontanous Breathing  Post-op Assessment: Report given to RN and Post -op Vital signs reviewed and stable  Post vital signs: Reviewed and stable  Last Vitals:  Vitals Value Taken Time  BP 120/99 04/01/22 1048  Temp    Pulse 70 04/01/22 1050  Resp 17 04/01/22 1050  SpO2 96 % 04/01/22 1050  Vitals shown include unvalidated device data.  Last Pain:  Vitals:   04/01/22 0739  TempSrc:   PainSc: 0-No pain         Complications: No notable events documented.

## 2022-04-01 NOTE — Interval H&P Note (Signed)
History and Physical Interval Note:  04/01/2022 7:37 AM  Stacy Hammond  has presented today for surgery, with the diagnosis of ESRD.  The various methods of treatment have been discussed with the patient and family. After consideration of risks, benefits and other options for treatment, the patient has consented to  Procedure(s): RIGHT ARM ARTERIOVENOUS (AV) FISTULA VERSUS ARTERIOVENOUS GRAFT CREATION (Right) as a surgical intervention.  The patient's history has been reviewed, patient examined, no change in status, stable for surgery.  I have reviewed the patient's chart and labs.  Questions were answered to the patient's satisfaction.     Servando Snare

## 2022-04-02 NOTE — Anesthesia Postprocedure Evaluation (Signed)
Anesthesia Post Note  Patient: Stacy Hammond  Procedure(s) Performed: RIGHT ARM ARTERIOVENOUS (AV) FISTULA  CREATION (Right: Arm Upper)     Patient location during evaluation: PACU Anesthesia Type: General Level of consciousness: awake Pain management: pain level controlled Vital Signs Assessment: post-procedure vital signs reviewed and stable Respiratory status: spontaneous breathing, nonlabored ventilation, respiratory function stable and patient connected to nasal cannula oxygen Cardiovascular status: blood pressure returned to baseline and stable Postop Assessment: no apparent nausea or vomiting Anesthetic complications: no   No notable events documented.  Last Vitals:  Vitals:   04/01/22 1100 04/01/22 1115  BP: 121/77   Pulse: 70 70  Resp: 15 16  Temp:  36.4 C  SpO2: 93% 95%    Last Pain:  Vitals:   04/01/22 1115  TempSrc:   PainSc: 0-No pain                 Moritz Lever P Cadan Maggart

## 2022-04-03 ENCOUNTER — Emergency Department (HOSPITAL_COMMUNITY)
Admission: EM | Admit: 2022-04-03 | Discharge: 2022-04-03 | Disposition: A | Discharge status: Home / Self Care | Payer: Medicare Other | Attending: Emergency Medicine | Admitting: Emergency Medicine

## 2022-04-03 ENCOUNTER — Encounter (HOSPITAL_COMMUNITY): Payer: Self-pay | Admitting: Vascular Surgery

## 2022-04-03 DIAGNOSIS — Z7901 Long term (current) use of anticoagulants: Secondary | ICD-10-CM | POA: Insufficient documentation

## 2022-04-03 DIAGNOSIS — M7989 Other specified soft tissue disorders: Secondary | ICD-10-CM | POA: Diagnosis present

## 2022-04-03 DIAGNOSIS — G8918 Other acute postprocedural pain: Secondary | ICD-10-CM

## 2022-04-03 NOTE — Progress Notes (Signed)
[facility]ISTS OF  PROGRESS NOTE  ASSESSMENT / PLAN: Stacy Hammond is a 76 y.o. female POD#2 right first stage brachiobasilic arteriovenous fistula creation by Dr. Donzetta Matters. Her daughter called over the weekend x 2 concerned about the skin about her mother's incision. I instructed her to present to the ER. She has mild ecchymosis about the incision, likely from resuming her Eliquis as indicated POD#1. The fistula has a weak palpable thrill consistent with early fistula. I counseled the patient about the benign nature of this, and encouraged expectant management. She should continue Eliquis and follow up with Dr. Donzetta Matters as scheduled. I have written discharge orders.   SUBJECTIVE: Her daughter called over the weekend x 2 concerned about the skin about her mother's incision. I instructed her to present to the ER. Presents to ER for evaluation.   OBJECTIVE: BP 132/67 (BP Location: Left Arm)   Pulse 70   Temp 97.9 F (36.6 C) (Oral)   Resp 20   LMP  (LMP Unknown)   SpO2 97%  No intake or output data in the 24 hours ending 04/03/22 2158   No acute distress Regular rate and rhythm Unlabored  AV fistula with mild thrill Ecchymosis about the incision     Latest Ref Rng & Units 04/01/2022    7:54 AM 03/27/2020    8:43 AM 06/01/2018    6:08 AM  CBC  Hemoglobin 12.0 - 15.0 g/dL 16.3  12.9  13.3   Hematocrit 36.0 - 46.0 % 48.0  38.0  39.0         Latest Ref Rng & Units 04/01/2022    7:54 AM 03/27/2020    8:43 AM 06/01/2018    6:08 AM  CMP  Glucose 70 - 99 mg/dL 136  94  95   BUN 8 - 23 mg/dL 31  24    Creatinine 0.44 - 1.00 mg/dL 8.30  8.10    Sodium 135 - 145 mmol/L 138  136  141   Potassium 3.5 - 5.1 mmol/L 3.9  4.3  3.9   Chloride 98 - 111 mmol/L 101  95      Estimated Creatinine Clearance: 6.2 mL/min (A) (by C-G formula based on SCr of 8.3 mg/dL (H)).  Stacy Aline. Stanford Breed, MD [facility] Phone Number: [phone] 04/03/2022 9:58 PM

## 2022-04-03 NOTE — Discharge Instructions (Signed)
Follow-up with your doctor as directed

## 2022-04-03 NOTE — ED Triage Notes (Signed)
Pt had dialysis shunt placed on Friday here, yesterday began to have bruising and swelling with drainage from site, spoke with surgeon that planned to meet her here, decreased thrill, last received dialysis yesterday

## 2022-04-03 NOTE — ED Provider Notes (Signed)
[facility] EMERGENCY DEPARTMENT Provider Note   CSN: [id number] Arrival date & time: 04/03/22  1929     History  Chief Complaint  Patient presents with   Post-op Problem    Stacy Hammond is a 76 y.o. female.  76 year old female presents with pain and swelling to her right forearm.  Patient had a dialysis graft placed 2 days ago.  States that she has not felt a thrill in that arm for a day.  No weakness into her right hand but does note some increased pain with movement of her fingers.  No fever or chills.  Called the vascular surgeon was told to come here.       Home Medications Prior to Admission medications   Medication Sig Start Date End Date Taking? Authorizing Provider  amiodarone (PACERONE) 100 MG tablet Take 100 mg by mouth daily.    [provider]  apixaban (ELIQUIS) 2.5 MG TABS tablet Take 2.5 mg by mouth 2 (two) times daily. 02/17/22   [provider]  atorvastatin (LIPITOR) 40 MG tablet Take 40 mg by mouth daily.    [provider]  cinacalcet (SENSIPAR) 30 MG tablet Take 30 mg by mouth daily with breakfast.     [provider]  midodrine (PROAMATINE) 10 MG tablet Take 10 mg by mouth See admin instructions. Take 10 mg by twice daily on Tuesday, Thursday and Saturday 05/01/18   [provider]  sucroferric oxyhydroxide (VELPHORO) 500 MG chewable tablet Chew 1,000 mg by mouth 3 (three) times daily with meals.     [provider]  traMADol (ULTRAM) 50 MG tablet Take 1 tablet (50 mg total) by mouth every 6 (six) hours as needed. 04/01/22 04/01/23  Baglia, Corrina, PA-C      Allergies    Penicillins and Oxycodone    Review of Systems   Review of Systems  All other systems reviewed and are negative.   Physical Exam Updated Vital Signs BP 121/78 (BP Location: Left Arm)   Pulse 70   Temp 97.9 F (36.6 C) (Oral)   Resp 20   LMP  (LMP Unknown)   SpO2 97%  Physical Exam Vitals and nursing note  reviewed.  Constitutional:      General: She is not in acute distress.    Appearance: Normal appearance. She is well-developed. She is not toxic-appearing.  HENT:     Head: Normocephalic and atraumatic.  Eyes:     General: Lids are normal.     Conjunctiva/sclera: Conjunctivae normal.     Pupils: Pupils are equal, round, and reactive to light.  Neck:     Thyroid: No thyroid mass.     Trachea: No tracheal deviation.  Cardiovascular:     Rate and Rhythm: Normal rate and regular rhythm.     Heart sounds: Normal heart sounds. No murmur heard.    No gallop.  Pulmonary:     Effort: Pulmonary effort is normal. No respiratory distress.     Breath sounds: Normal breath sounds. No stridor. No decreased breath sounds, wheezing, rhonchi or rales.  Abdominal:     General: There is no distension.     Palpations: Abdomen is soft.     Tenderness: There is no abdominal tenderness. There is no rebound.  Musculoskeletal:        General: No tenderness. Normal range of motion.       Arms:     Cervical back: Normal range of motion and neck supple.  Skin:  General: Skin is warm and dry.     Findings: No abrasion or rash.  Neurological:     Mental Status: She is alert and oriented to person, place, and time. Mental status is at baseline.     GCS: GCS eye subscore is 4. GCS verbal subscore is 5. GCS motor subscore is 6.     Cranial Nerves: No cranial nerve deficit.     Sensory: No sensory deficit.     Motor: Motor function is intact.  Psychiatric:        Attention and Perception: Attention normal.        Speech: Speech normal.        Behavior: Behavior normal.     ED Results / Procedures / Treatments   Labs (all labs ordered are listed, but only abnormal results are displayed) Labs Reviewed - No data to display  EKG None  Radiology No results found.  Procedures Procedures    Medications Ordered in ED Medications - No data to display  ED Course/ Medical Decision Making/ A&P                            Medical Decision Making  Patient with likely clotted graft.  We will contact vascular surgeon on-call        Final Clinical Impression(s) / ED Diagnoses Final diagnoses:  None    Rx / DC Orders ED Discharge Orders     None         Lacretia Leigh, MD 04/03/22 2021

## 2022-04-03 NOTE — ED Notes (Signed)
Discharge instructions reviewed with patient. Patient verbalized understanding of instructions. Follow-up care and medications were reviewed. Patient ambulatory with steady gait. VSS upon discharge.  ?

## 2022-04-15 ENCOUNTER — Telehealth: Payer: Self-pay | Admitting: Vascular Surgery

## 2022-04-15 NOTE — Telephone Encounter (Signed)
Baglia, Corrina, PA-C  P [facility] Charge Pool; P [facility] Admin Pool S/p creation of right AV fistula by Dr. Donzetta Matters. She needs follow up in 4-6 weeks with fistula duplex   Made call to Schedule post op no answer. VM full

## 2022-04-26 ENCOUNTER — Telehealth: Payer: Self-pay

## 2022-04-26 DIAGNOSIS — N186 End stage renal disease: Secondary | ICD-10-CM

## 2022-04-26 NOTE — Progress Notes (Unsigned)
POST OPERATIVE OFFICE NOTE    CC:  F/u for surgery  HPI:  This is a 76 y.o. 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 Donzetta Matters.  Pt was seen on 04/03/2022 for concerns about the skin around the incision.  She had some mild bruising most likely from Eliquis.    Pt's daughter called on 7/25 with concerns about the pt's fingers and arm having some swelling despite elevation.  Her hand was cold and a darkened area on her palm.  She is able to use her hand but she has  pain and soreness.  Given the above, she was scheduled to be seen.  She has hx of multiple previous left sided arm access procedures.  Pt is right hand dominant.  Pt states she does not have pain/numbness in the right hand.  She does have some swelling in the right arm upper and lower.  She has hx of right sided PPM as it was not put on the left due to her left arm HD access at the time of placement.  She has been elevating her arm but the swelling hasn't changed much.  She states she has a knot at the incision.  The pt is on dialysis T/T/S at [facility] location.   Allergies  Allergen Reactions   Penicillins Hives    Has patient had a PCN reaction causing immediate rash, facial/tongue/throat swelling, SOB or lightheadedness with hypotension: No Has patient had a PCN reaction causing severe rash involving mucus membranes or skin necrosis: No Has patient had a PCN reaction that required hospitalization: No Has patient had a PCN reaction occurring within the last 10 years: Yes If all of the above answers are "NO", then may proceed with Cephalosporin use.    Oxycodone Nausea And Vomiting    Current Outpatient Medications  Medication Sig Dispense Refill   amiodarone (PACERONE) 100 MG tablet Take 100 mg by mouth daily.     apixaban (ELIQUIS) 2.5 MG TABS tablet Take 2.5 mg by mouth 2 (two) times daily.     atorvastatin (LIPITOR) 40 MG tablet Take 40 mg by mouth daily.     cinacalcet (SENSIPAR) 30 MG tablet Take 30 mg  by mouth daily with breakfast.      midodrine (PROAMATINE) 10 MG tablet Take 10 mg by mouth See admin instructions. Take 10 mg by twice daily on Tuesday, Thursday and Saturday  2   sucroferric oxyhydroxide (VELPHORO) 500 MG chewable tablet Chew 1,000 mg by mouth 3 (three) times daily with meals.      traMADol (ULTRAM) 50 MG tablet Take 1 tablet (50 mg total) by mouth every 6 (six) hours as needed. 10 tablet 0   No current facility-administered medications for this visit.     ROS:  See HPI  Physical Exam:  Today's Vitals   04/27/22 0918  Pulse: 75  Resp: 20  Temp: 97.6 F (36.4 C)  TempSrc: Temporal  SpO2: 100%  Weight: 183 lb (83 kg)  Height: '5\' 4"'$  (1.626 m)  PainSc: 8   PainLoc: Arm   Body mass index is 31.41 kg/m.   Incision:  healed nicely Extremities:   There is a brisk right radial pulse > ulnar pulse   Motor and sensory are in tact.   There is a thrill/bruit present.  The fistula is not easily palpable due to depth and swelling    Assessment/Plan:  This is a 76 y.o. female who is s/p: 1st stage BVT on 04/01/2022  by Dr. Donzetta Matters  -the pt does not have evidence of steal. -she does have some swelling in the right arm, which is most likely multifactorial.  She is on Eliquis and does have a hematoma at the incision site.  She also has a right sided PPM.  I discussed with Dr. Scot Dock and will have pt keep appt on 8/30 for dialysis duplex and discuss 2nd stage BVT.  If she continues to have swelling, she may need fistulogram after BVT to evaluate for central stenosis.   -she is instructed to continue to elevate her right arm -if pt has tunneled catheter, this can be removed at the discretion of the dialysis center once the fistula/graft has been accessed to their satisfaction.  -discussed with pt that access does not last forever and will need intervention or even new access at some point.  -she and her daughter are in agreement with this plan.   Leontine Locket,  [facility] [phone]  Clinic MD:  Scot Dock

## 2022-04-26 NOTE — Telephone Encounter (Signed)
Pt's daughter, Denman George, called stating that the HD center provider told her to call to make an appt in this office before her scheduled appt.  Reviewed pt's chart, returned call for clarification, two identifiers used. She stated that over the past weekend, the pt's arm and fingers had begun to swell. HD center told them it was normal. The swelling has since increased despite elevation, her hand is cold, and there is a darkened area in her palm. She denies any numbness or open wounds. She is able to use her hand, but there is some pain and soreness.   Spoke to [place] PA who advised that pt be seen with no study needed. Called pt's daughter and made appt for 7/26 at 0915. Confirmed understanding.

## 2022-04-27 ENCOUNTER — Ambulatory Visit (INDEPENDENT_AMBULATORY_CARE_PROVIDER_SITE_OTHER): Payer: Medicare Other | Admitting: Physician Assistant

## 2022-04-27 ENCOUNTER — Encounter: Payer: Self-pay | Admitting: Physician Assistant

## 2022-04-27 VITALS — HR 75 | Temp 97.6°F | Resp 20 | Ht 64.0 in | Wt 183.0 lb

## 2022-04-27 DIAGNOSIS — Z992 Dependence on renal dialysis: Secondary | ICD-10-CM

## 2022-04-27 DIAGNOSIS — N186 End stage renal disease: Secondary | ICD-10-CM

## 2022-05-16 ENCOUNTER — Encounter (HOSPITAL_COMMUNITY): Payer: Medicare Other

## 2022-06-01 ENCOUNTER — Ambulatory Visit: Payer: Medicare Other | Admitting: Vascular Surgery

## 2022-06-01 ENCOUNTER — Ambulatory Visit (HOSPITAL_COMMUNITY): Payer: Medicare Other

## 2022-06-01 ENCOUNTER — Encounter (HOSPITAL_COMMUNITY): Payer: Medicare Other

## 2022-06-08 ENCOUNTER — Ambulatory Visit (HOSPITAL_COMMUNITY)
Admission: RE | Admit: 2022-06-08 | Discharge: 2022-06-08 | Disposition: A | Discharge status: Home / Self Care | Payer: Medicare Other | Source: Ambulatory Visit | Attending: Vascular Surgery | Admitting: Vascular Surgery

## 2022-06-08 ENCOUNTER — Encounter: Payer: Self-pay | Admitting: Vascular Surgery

## 2022-06-08 ENCOUNTER — Ambulatory Visit (INDEPENDENT_AMBULATORY_CARE_PROVIDER_SITE_OTHER): Payer: Medicare Other | Admitting: Vascular Surgery

## 2022-06-08 VITALS — BP 163/88 | HR 79 | Temp 97.9°F | Resp 20 | Ht 64.0 in | Wt 189.0 lb

## 2022-06-08 DIAGNOSIS — Z992 Dependence on renal dialysis: Secondary | ICD-10-CM | POA: Insufficient documentation

## 2022-06-08 DIAGNOSIS — N186 End stage renal disease: Secondary | ICD-10-CM | POA: Diagnosis not present

## 2022-06-08 NOTE — H&P (View-Only) (Signed)
     Subjective:     Patient ID: Stacy Hammond, female   DOB: November 08, 1945, 76 y.o.   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  Currently has a catheter on the left IJ.  She also has a pacemaker on the right.  Most recent procedure was a right basilic vein AV fistula creation but it had significant swelling in the right arm and hand since the time of procedure.  Fistula has been unable to be revised due to significant swelling.   Review of Systems Right arm swelling    Objective:   Physical Exam Vitals:   06/08/22 1556  BP: (!) 163/88  Pulse: 79  Resp: 20  Temp: 97.9 F (36.6 C)  SpO2: 97%   Awake alert oriented Right arm with significant swelling Incision is well-healed and there is a palpable thrill deep in the right upper    AVF                 PSV (cm/s)Flow Vol (mL/min)Comments  +--------------------+----------+-----------------+--------+  Native artery inflow    80          1878                 +--------------------+----------+-----------------+--------+  AVF Anastomosis        495                               +--------------------+----------+-----------------+--------+      +------------+----------+-------------+----------+--------+  OUTFLOW VEINPSV (cm/s)Diameter (cm)Depth (cm)Describe  +------------+----------+-------------+----------+--------+  Prox UA        168        0.67        1.43             +------------+----------+-------------+----------+--------+  Mid UA         154        0.61        2.46             +------------+----------+-------------+----------+--------+  Dist UA        193        0.63        2.47             +------------+----------+-------------+----------+--------+          Summary:  Patent right Brachial to basilic vein AVF with no visualized stenosis     Assessment/plan     75 year old female status post right vein fistula  creation.  She does have significant swelling in the right extremity likely due to pacemaker obstruction of the veins and I recommended fistulogram and she will possibly need either central intervention versus ligation of the fistula although she has no left upper extremity access.  We will hold Eliquis 2 days prior to procedure and get this scheduled on a nondialysis day in the near future.    Ayomikun Starling C. Donzetta Matters, MD [facility] [place] Office: [phone] Pager: [phone]

## 2022-06-08 NOTE — Progress Notes (Signed)
     Subjective:     Patient ID: Stacy Hammond, female   DOB: Mar 22, 1946, 76 y.o.   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  Currently has a catheter on the left IJ.  She also has a pacemaker on the right.  Most recent procedure was a right basilic vein AV fistula creation but it had significant swelling in the right arm and hand since the time of procedure.  Fistula has been unable to be revised due to significant swelling.   Review of Systems Right arm swelling    Objective:   Physical Exam Vitals:   06/08/22 1556  BP: (!) 163/88  Pulse: 79  Resp: 20  Temp: 97.9 F (36.6 C)  SpO2: 97%   Awake alert oriented Right arm with significant swelling Incision is well-healed and there is a palpable thrill deep in the right upper    AVF                 PSV (cm/s)Flow Vol (mL/min)Comments  +--------------------+----------+-----------------+--------+  Native artery inflow    80          1878                 +--------------------+----------+-----------------+--------+  AVF Anastomosis        495                               +--------------------+----------+-----------------+--------+      +------------+----------+-------------+----------+--------+  OUTFLOW VEINPSV (cm/s)Diameter (cm)Depth (cm)Describe  +------------+----------+-------------+----------+--------+  Prox UA        168        0.67        1.43             +------------+----------+-------------+----------+--------+  Mid UA         154        0.61        2.46             +------------+----------+-------------+----------+--------+  Dist UA        193        0.63        2.47             +------------+----------+-------------+----------+--------+          Summary:  Patent right Brachial to basilic vein AVF with no visualized stenosis     Assessment/plan     76 year old female status post right vein fistula  creation.  She does have significant swelling in the right extremity likely due to pacemaker obstruction of the veins and I recommended fistulogram and she will possibly need either central intervention versus ligation of the fistula although she has no left upper extremity access.  We will hold Eliquis 2 days prior to procedure and get this scheduled on a nondialysis day in the near future.    Dorleen Kissel C. Donzetta Matters, MD [facility] [place] Office: [phone] Pager: [phone]

## 2022-06-14 ENCOUNTER — Other Ambulatory Visit: Payer: Self-pay

## 2022-06-14 DIAGNOSIS — N186 End stage renal disease: Secondary | ICD-10-CM

## 2022-06-27 ENCOUNTER — Encounter (HOSPITAL_COMMUNITY): Payer: Self-pay | Admitting: Vascular Surgery

## 2022-06-27 ENCOUNTER — Other Ambulatory Visit: Payer: Self-pay

## 2022-06-27 ENCOUNTER — Encounter (HOSPITAL_COMMUNITY)
Admission: RE | Disposition: A | Discharge status: Home / Self Care | Payer: Self-pay | Source: Ambulatory Visit | Attending: Vascular Surgery

## 2022-06-27 ENCOUNTER — Telehealth: Payer: Self-pay | Admitting: Vascular Surgery

## 2022-06-27 ENCOUNTER — Ambulatory Visit (HOSPITAL_COMMUNITY)
Admission: RE | Admit: 2022-06-27 | Discharge: 2022-06-27 | Disposition: A | Discharge status: Home / Self Care | Payer: Medicare Other | Source: Ambulatory Visit | Attending: Vascular Surgery | Admitting: Vascular Surgery

## 2022-06-27 DIAGNOSIS — T82858A Stenosis of vascular prosthetic devices, implants and grafts, initial encounter: Secondary | ICD-10-CM | POA: Diagnosis not present

## 2022-06-27 DIAGNOSIS — M7989 Other specified soft tissue disorders: Secondary | ICD-10-CM | POA: Diagnosis not present

## 2022-06-27 DIAGNOSIS — T82898A Other specified complication of vascular prosthetic devices, implants and grafts, initial encounter: Secondary | ICD-10-CM

## 2022-06-27 DIAGNOSIS — N186 End stage renal disease: Secondary | ICD-10-CM | POA: Insufficient documentation

## 2022-06-27 DIAGNOSIS — Y841 Kidney dialysis as the cause of abnormal reaction of the patient, or of later complication, without mention of misadventure at the time of the procedure: Secondary | ICD-10-CM | POA: Diagnosis not present

## 2022-06-27 DIAGNOSIS — Z992 Dependence on renal dialysis: Secondary | ICD-10-CM | POA: Insufficient documentation

## 2022-06-27 DIAGNOSIS — Z950 Presence of cardiac pacemaker: Secondary | ICD-10-CM | POA: Diagnosis not present

## 2022-06-27 DIAGNOSIS — N185 Chronic kidney disease, stage 5: Secondary | ICD-10-CM

## 2022-06-27 HISTORY — PX: PERIPHERAL VASCULAR BALLOON ANGIOPLASTY: CATH118281

## 2022-06-27 LAB — POCT I-STAT, CHEM 8
BUN: 70 mg/dL — ABNORMAL HIGH (ref 8–23)
BUN: 98 mg/dL — ABNORMAL HIGH (ref 8–23)
Calcium, Ion: 1.07 mmol/L — ABNORMAL LOW (ref 1.15–1.40)
Calcium, Ion: 1.08 mmol/L — ABNORMAL LOW (ref 1.15–1.40)
Chloride: 104 mmol/L (ref 98–111)
Chloride: 104 mmol/L (ref 98–111)
Creatinine, Ser: 11 mg/dL — ABNORMAL HIGH (ref 0.44–1.00)
Creatinine, Ser: 11.2 mg/dL — ABNORMAL HIGH (ref 0.44–1.00)
Glucose, Bld: 146 mg/dL — ABNORMAL HIGH (ref 70–99)
Glucose, Bld: 148 mg/dL — ABNORMAL HIGH (ref 70–99)
HCT: 44 % (ref 36.0–46.0)
HCT: 45 % (ref 36.0–46.0)
Hemoglobin: 15 g/dL (ref 12.0–15.0)
Hemoglobin: 15.3 g/dL — ABNORMAL HIGH (ref 12.0–15.0)
Potassium: 4.7 mmol/L (ref 3.5–5.1)
Potassium: 6.2 mmol/L — ABNORMAL HIGH (ref 3.5–5.1)
Sodium: 134 mmol/L — ABNORMAL LOW (ref 135–145)
Sodium: 135 mmol/L (ref 135–145)
TCO2: 28 mmol/L (ref 22–32)
TCO2: 28 mmol/L (ref 22–32)

## 2022-06-27 SURGERY — PERIPHERAL VASCULAR BALLOON ANGIOPLASTY
Anesthesia: LOCAL | Laterality: Right

## 2022-06-27 MED ORDER — IODIXANOL 320 MG/ML IV SOLN
INTRAVENOUS | Status: DC | PRN
  Administered 2022-06-27: 35 mL

## 2022-06-27 MED ORDER — LIDOCAINE HCL (PF) 1 % IJ SOLN
INTRAMUSCULAR | Status: AC
  Filled 2022-06-27: qty 30

## 2022-06-27 MED ORDER — HEPARIN (PORCINE) IN NACL 1000-0.9 UT/500ML-% IV SOLN
INTRAVENOUS | Status: AC
  Filled 2022-06-27: qty 500

## 2022-06-27 MED ORDER — HEPARIN (PORCINE) IN NACL 1000-0.9 UT/500ML-% IV SOLN
INTRAVENOUS | Status: DC | PRN
  Administered 2022-06-27: 500 mL

## 2022-06-27 MED ORDER — SODIUM CHLORIDE 0.9% FLUSH: 3.0000 mL | Freq: Two times a day (BID) | INTRAVENOUS | Status: DC

## 2022-06-27 MED ORDER — SODIUM CHLORIDE 0.9% FLUSH: 3.0000 mL | INTRAVENOUS | Status: DC | PRN

## 2022-06-27 MED ORDER — SODIUM CHLORIDE 0.9 % IV SOLN: 250.0000 mL | INTRAVENOUS | Status: DC | PRN

## 2022-06-27 MED ORDER — LIDOCAINE HCL (PF) 1 % IJ SOLN
INTRAMUSCULAR | Status: DC | PRN
  Administered 2022-06-27: 8 mL

## 2022-06-27 SURGICAL SUPPLY — 22 items
BAG SNAP BAND KOVER 36X36 (MISCELLANEOUS) ×1 IMPLANT
BALLN LUTONIX AV 9X60X75 (BALLOONS) ×1
BALLN MUSTANG 10X80X75 (BALLOONS) ×1
BALLN MUSTANG 8.0X40 75 (BALLOONS) ×1
BALLOON LUTONIX AV 9X60X75 (BALLOONS) IMPLANT
BALLOON MUSTANG 10X80X75 (BALLOONS) IMPLANT
BALLOON MUSTANG 8.0X40 75 (BALLOONS) IMPLANT
CATH ANGIO 5F BER2 65CM (CATHETERS) IMPLANT
COVER DOME SNAP 22 D (MISCELLANEOUS) ×1 IMPLANT
GUIDEWIRE ANGLED .035X150CM (WIRE) IMPLANT
KIT ENCORE 26 ADVANTAGE (KITS) IMPLANT
KIT MICROPUNCTURE NIT STIFF (SHEATH) IMPLANT
PROTECTION STATION PRESSURIZED (MISCELLANEOUS) ×1
SHEATH PINNACLE R/O II 5F 6CM (SHEATH) IMPLANT
SHEATH PINNACLE R/O II 6F 4CM (SHEATH) IMPLANT
SHEATH PINNACLE R/O II 7F 4CM (SHEATH) IMPLANT
SHEATH PROBE COVER 6X72 (BAG) ×1 IMPLANT
STATION PROTECTION PRESSURIZED (MISCELLANEOUS) ×1 IMPLANT
STOPCOCK MORSE 400PSI 3WAY (MISCELLANEOUS) ×1 IMPLANT
TRAY PV CATH (CUSTOM PROCEDURE TRAY) ×1 IMPLANT
TUBING CIL FLEX 10 FLL-RA (TUBING) ×1 IMPLANT
WIRE ROSEN-J .035X180CM (WIRE) IMPLANT

## 2022-06-27 NOTE — Interval H&P Note (Signed)
History and Physical Interval Note:  06/27/2022 7:16 AM  Stacy Hammond  has presented today for surgery, with the diagnosis of instage renal.  The various methods of treatment have been discussed with the patient and family. After consideration of risks, benefits and other options for treatment, the patient has consented to  Procedure(s): A/V Fistulagram (Right) as a surgical intervention.  The patient's history has been reviewed, patient examined, no change in status, stable for surgery.  I have reviewed the patient's chart and labs.  Questions were answered to the patient's satisfaction.     Servando Snare

## 2022-06-27 NOTE — Telephone Encounter (Signed)
-----   Message from Waynetta Sandy, MD sent at 06/27/2022  9:51 AM EDT ----- Stacy Hammond [id number]  06/27/2022 Pre-operative Diagnosis: End-stage renal disease, right arm swelling with evidence of malfunction right arm AV fistula  Surgeon:  Eda Paschal. Donzetta Matters, MD  Procedure Performed: 1.  Ultrasound-guided cannulation right arm AV fistula 2.  Right upper extremity fistulogram 3.  Drug-coated balloon angioplasty of right innominate and subclavian veins with 9 x 60 mm Lutonix  Please have patient follow-up with me or PA in 3 to 4 weeks with right upper extremity dialysis duplex.  Erlene Quan

## 2022-06-27 NOTE — Op Note (Signed)
    Patient name: Stacy Hammond MRN: [id number] DOB: [birth year] Sex: female  06/27/2022 Pre-operative Diagnosis: End-stage renal disease, right arm swelling with evidence of malfunction right arm AV fistula Post-operative diagnosis:  Same Surgeon:  Eda Paschal. Donzetta Matters, MD Procedure Performed: 1.  Ultrasound-guided cannulation right arm AV fistula 2.  Right upper extremity fistulogram 3.  Drug-coated balloon angioplasty of right innominate and subclavian veins with 9 x 60 mm Lutonix  Indications: 76 year old female with multiple previous accesses in her left upper extremity now on dialysis via left IJ catheter also has a right subclavian based pacemaker and a right basilic vein AV fistula which has failed to mature and has significant swelling.  She is now indicated for fistulogram with possible intervention.  Findings: There was an occlusion right where the catheter and pacemaker wires crossed at the innominate into the SVC and also an occlusion likely at the thoracic outlet and the subclavian vein.  These were initially ballooned with 8 and 10 mm plain balloon and then dilated with 9 mm drug-coated balloon and at completion there was no residual stenosis there was brisk flow and a strong thrill in the fistula.  Plan will be to have patient follow-up in a few weeks we will evaluate for persistent swelling in the right upper extremity.  If the edema has resolved we can consider second stage basilic vein transposition if it has not resolved if she is still having any issues we will likely just ligate the fistula and she will be catheter dependent.   Procedure:  The patient was identified in the holding area and taken to room 8.  The patient was then placed supine on the table and prepped and draped in the usual sterile fashion.  A time out was called.  Ultrasound was used to evaluate the right arm basilic vein fistula and there was noted to have significant subcutaneous edema.  The area was anesthetized  with 1% lidocaine cannulated with a micropuncture needle followed by wire and sheath with direct ultrasound visualization and images saved to the permanent record.  We performed right upper extremity fistulogram with the above findings and then placed a J-wire and a 7 [place] sheath.  We then crossed the stenosed area with Glidewire and Berenstein catheter and then placed a longer Rosen wire into the IVC.  We then began dilating with first 8 mm followed by 10 mm balloon at the level of occlusions and this demonstrated adequate dilatation and then we used a drug-coated balloon 9 x 60 inflated at nominal pressure for 3 minutes.  Completion demonstrated no residual stenosis with this we elected to carefully remove the wire around the pacemaker wires and then suture-ligated the cannulation site.  She tolerated the procedure well without any complication.   Contrast: 35cc  Stacy Hammond C. Donzetta Matters, MD [facility] [place] Office: [phone] Pager: [phone]

## 2022-06-27 NOTE — Progress Notes (Signed)
Patient was given discharge instructions. She verbalized understanding. 

## 2022-07-25 ENCOUNTER — Other Ambulatory Visit: Payer: Self-pay | Admitting: *Deleted

## 2022-07-25 DIAGNOSIS — N186 End stage renal disease: Secondary | ICD-10-CM

## 2022-08-03 ENCOUNTER — Ambulatory Visit (HOSPITAL_COMMUNITY)
Admission: RE | Admit: 2022-08-03 | Discharge: 2022-08-03 | Disposition: A | Discharge status: Home / Self Care | Payer: Medicare Other | Source: Ambulatory Visit | Attending: Vascular Surgery | Admitting: Vascular Surgery

## 2022-08-03 ENCOUNTER — Encounter: Payer: Self-pay | Admitting: Vascular Surgery

## 2022-08-03 ENCOUNTER — Ambulatory Visit (INDEPENDENT_AMBULATORY_CARE_PROVIDER_SITE_OTHER): Payer: Medicare Other | Admitting: Vascular Surgery

## 2022-08-03 VITALS — BP 156/108 | HR 96 | Temp 98.0°F | Resp 20 | Ht 64.0 in | Wt 185.0 lb

## 2022-08-03 DIAGNOSIS — N186 End stage renal disease: Secondary | ICD-10-CM | POA: Insufficient documentation

## 2022-08-03 NOTE — Progress Notes (Signed)
Patient ID: Stacy Hammond, female   DOB: [birth year], 76 y.o.   MRN: [id number]  Reason for Consult: Routine Post Op   Referred by Alessandra Grout, MD  Subjective:     HPI:  Stacy Hammond is a 76 y.o. female with end-stage renal disease currently dialyzing via left sided catheter.  She has undergone first stage basilic vein fistula and had significant swelling after this from pacemaker and then underwent fistulogram with balloon angioplasty.  She now follows up from that procedure.  States that immediately after the procedure her edema started improving and by 3 days was completely resolved.  Currently on Eliquis.  No right upper extremity complaints.  She does have a pacemaker in place.  Currently dialyzes Tuesdays, Thursdays and Saturdays in [place].  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 No pertinent family history. Past Surgical History:  Procedure Laterality Date   ABDOMINAL HYSTERECTOMY     partial   ANKLE FUSION Right 2014?   done at [place] Left 03/27/2020   Procedure: INSERTION OF LEFT UPPER ARM ARTERIOVENOUS (AV) GORE-TEX GRAFT ARM USING 4X7MM X 45 CM STRETCH GORETEX GRAFT;  Surgeon: Rosetta Posner, MD;  Location: [place];  Service: Vascular;  Laterality: Left;   AV FISTULA  PLACEMENT Right 04/01/2022   Procedure: RIGHT ARM ARTERIOVENOUS (AV) FISTULA  CREATION;  Surgeon: Waynetta Sandy, MD;  Location: [place];  Service: Vascular;  Laterality: Right;   BREAST LUMPECTOMY Left    COLONOSCOPY W/ POLYPECTOMY     EYE SURGERY Bilateral    Cataract   FISTULA SUPERFICIALIZATION Left 06/01/2018   Procedure: FISTULA PLICATION LEFT ARM;  Surgeon: Serafina Mitchell, MD;  [facility];  Service: Vascular;  Laterality: Left;   INSERTION OF DIALYSIS CATHETER Left 03/27/2020   Procedure: INSERTION OF TUNNELED DIALYSIS CATHETER USING PALINDROME 23CM;  Surgeon: Rosetta Posner, MD;  [facility];  Service: Vascular;  Laterality: Left;   PACEMAKER PLACEMENT  03/21/2015   [employer]   PERIPHERAL VASCULAR BALLOON ANGIOPLASTY Right 06/27/2022   Procedure: PERIPHERAL VASCULAR BALLOON ANGIOPLASTY;  Surgeon: Waynetta Sandy, MD;  Location: [place] CV LAB;  Service: Cardiovascular;  Laterality: Right;   REVISON OF ARTERIOVENOUS FISTULA Left 09/14/2016   Procedure: LEFT CEPHALIC TURNDOWN;  Surgeon: Conrad Humphrey, MD;  Location: [place];  Service: Vascular;  Laterality: Left;    Short Social History:  Social History   Tobacco Use   Smoking status: Former    Years: 20.00    Types: Cigarettes    Quit date: 09/09/1986  Years since quitting: 35.9   Smokeless tobacco: Never  Substance Use Topics   Alcohol use: No    Allergies  Allergen Reactions   Penicillins Hives    Has patient had a PCN reaction causing immediate rash, facial/tongue/throat swelling, SOB or lightheadedness with hypotension: No Has patient had a PCN reaction causing severe rash involving mucus membranes or skin necrosis: No Has patient had a PCN reaction that required hospitalization: No Has patient had a PCN reaction occurring within the last 10 years: Yes If all of the above answers are "NO", then may proceed with Cephalosporin use.    Oxycodone Nausea And Vomiting    Current  Outpatient Medications  Medication Sig Dispense Refill   amiodarone (PACERONE) 100 MG tablet Take 100 mg by mouth daily.     apixaban (ELIQUIS) 2.5 MG TABS tablet Take 2.5 mg by mouth 2 (two) times daily.     atorvastatin (LIPITOR) 40 MG tablet Take 40 mg by mouth daily.     cinacalcet (SENSIPAR) 30 MG tablet Take 30 mg by mouth daily with breakfast.      midodrine (PROAMATINE) 10 MG tablet Take 10 mg by mouth See admin instructions. Take 10 mg by twice daily on Tuesday, Thursday and Saturday  2   sucroferric oxyhydroxide (VELPHORO) 500 MG chewable tablet Chew 1,000 mg by mouth 3 (three) times daily with meals.      traMADol (ULTRAM) 50 MG tablet Take 1 tablet (50 mg total) by mouth every 6 (six) hours as needed. 10 tablet 0   No current facility-administered medications for this visit.    Review of Systems  Constitutional:  Constitutional negative. HENT: HENT negative.  Eyes: Eyes negative.  Respiratory: Respiratory negative.  Cardiovascular: Cardiovascular negative.  GI: Gastrointestinal negative.  Musculoskeletal: Musculoskeletal negative.  Skin: Skin negative.  Neurological: Neurological negative. Hematologic: Hematologic/lymphatic negative.  Psychiatric: Psychiatric negative.        Objective:  Objective   Vitals:   08/03/22 1020  BP: (!) 156/108  Pulse: 96  Resp: 20  Temp: 98 F (36.7 C)  SpO2: 96%     Physical Exam HENT:     Head: Normocephalic.     Nose: Nose normal.     Mouth/Throat:     Mouth: Mucous membranes are moist.  Cardiovascular:     Rate and Rhythm: Normal rate.  Pulmonary:     Effort: Pulmonary effort is normal.  Abdominal:     General: Abdomen is flat.  Musculoskeletal:     Cervical back: Normal range of motion and neck supple.     Right lower leg: No edema.     Left lower leg: No edema.  Skin:    General: Skin is warm and dry.     Capillary Refill: Capillary refill takes less than 2 seconds.  Neurological:     General: No focal  deficit present.     Mental Status: She is alert.  Psychiatric:        Mood and Affect: Mood normal.        Behavior: Behavior normal.        Thought Content: Thought content normal.     Data: Findings:  +--------------------+----------+-----------------+--------+  AVF                 PSV (cm/s)Flow Vol (mL/min)Comments  +--------------------+----------+-----------------+--------+  Native artery inflow   270          1041                 +--------------------+----------+-----------------+--------+  AVF Anastomosis        824                               +--------------------+----------+-----------------+--------+      +------------+----------+-------------+----------+----------------+  OUTFLOW VEINPSV (cm/s)Diameter (cm)Depth (cm)    Describe      +------------+----------+-------------+----------+----------------+  Prox UA        142        0.69        1.64   competing branch  +------------+----------+-------------+----------+----------------+  Mid UA         179        0.65        1.78                     +------------+----------+-------------+----------+----------------+  Dist UA        137        0.64        2.67                     +------------+----------+-------------+----------+----------------+  AC Fossa       567        0.92        1.94                     +------------+----------+-------------+----------+----------------+          Summary:  Arteriovenous fistula-Stenosis noted in the anastomosis.      Assessment/Plan:    76 year old female on dialysis via left sided tunneled catheter with a history of pacemaker and recently had balloon angioplasty of her central veins on the right for significant right upper extremity edema.  Fistula is now matured and edema has resolved.  As such we will plan for second stage basilic vein fistula on a nondialysis day in the near future.  Eliquis will need to be held for 48 hours prior to  procedure.     Waynetta Sandy MD [facility]

## 2022-08-03 NOTE — H&P (View-Only) (Signed)
Patient ID: Stacy Hammond, female   DOB: 01-Aug-1946, 76 y.o.   MRN: [id number]  Reason for Consult: Routine Post Op   Referred by Alessandra Grout, MD  Subjective:     HPI:  Stacy Hammond is a 76 y.o. female with end-stage renal disease currently dialyzing via left sided catheter.  She has undergone first stage basilic vein fistula and had significant swelling after this from pacemaker and then underwent fistulogram with balloon angioplasty.  She now follows up from that procedure.  States that immediately after the procedure her edema started improving and by 3 days was completely resolved.  Currently on Eliquis.  No right upper extremity complaints.  She does have a pacemaker in place.  Currently dialyzes Tuesdays, Thursdays and Saturdays in [place].  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 No pertinent family history. Past Surgical History:  Procedure Laterality Date   ABDOMINAL HYSTERECTOMY     partial   ANKLE FUSION Right 2014?   done at [place] Left 03/27/2020   Procedure: INSERTION OF LEFT UPPER ARM ARTERIOVENOUS (AV) GORE-TEX GRAFT ARM USING 4X7MM X 45 CM STRETCH GORETEX GRAFT;  Surgeon: Rosetta Posner, MD;  Location: [place];  Service: Vascular;  Laterality: Left;   AV FISTULA  PLACEMENT Right 04/01/2022   Procedure: RIGHT ARM ARTERIOVENOUS (AV) FISTULA  CREATION;  Surgeon: Waynetta Sandy, MD;  Location: [place];  Service: Vascular;  Laterality: Right;   BREAST LUMPECTOMY Left    COLONOSCOPY W/ POLYPECTOMY     EYE SURGERY Bilateral    Cataract   FISTULA SUPERFICIALIZATION Left 06/01/2018   Procedure: FISTULA PLICATION LEFT ARM;  Surgeon: Serafina Mitchell, MD;  [facility];  Service: Vascular;  Laterality: Left;   INSERTION OF DIALYSIS CATHETER Left 03/27/2020   Procedure: INSERTION OF TUNNELED DIALYSIS CATHETER USING PALINDROME 23CM;  Surgeon: Rosetta Posner, MD;  Location: [facility] OR;  Service: Vascular;  Laterality: Left;   PACEMAKER PLACEMENT  03/21/2015   [employer]   PERIPHERAL VASCULAR BALLOON ANGIOPLASTY Right 06/27/2022   Procedure: PERIPHERAL VASCULAR BALLOON ANGIOPLASTY;  Surgeon: Waynetta Sandy, MD;  Location: [place] CV LAB;  Service: Cardiovascular;  Laterality: Right;   REVISON OF ARTERIOVENOUS FISTULA Left 09/14/2016   Procedure: LEFT CEPHALIC TURNDOWN;  Surgeon: Conrad [place], MD;  Location: [place];  Service: Vascular;  Laterality: Left;    Short Social History:  Social History   Tobacco Use   Smoking status: Former    Years: 20.00    Types: Cigarettes    Quit date: 09/09/1986  Years since quitting: 35.9   Smokeless tobacco: Never  Substance Use Topics   Alcohol use: No    Allergies  Allergen Reactions   Penicillins Hives    Has patient had a PCN reaction causing immediate rash, facial/tongue/throat swelling, SOB or lightheadedness with hypotension: No Has patient had a PCN reaction causing severe rash involving mucus membranes or skin necrosis: No Has patient had a PCN reaction that required hospitalization: No Has patient had a PCN reaction occurring within the last 10 years: Yes If all of the above answers are "NO", then may proceed with Cephalosporin use.    Oxycodone Nausea And Vomiting    Current  Outpatient Medications  Medication Sig Dispense Refill   amiodarone (PACERONE) 100 MG tablet Take 100 mg by mouth daily.     apixaban (ELIQUIS) 2.5 MG TABS tablet Take 2.5 mg by mouth 2 (two) times daily.     atorvastatin (LIPITOR) 40 MG tablet Take 40 mg by mouth daily.     cinacalcet (SENSIPAR) 30 MG tablet Take 30 mg by mouth daily with breakfast.      midodrine (PROAMATINE) 10 MG tablet Take 10 mg by mouth See admin instructions. Take 10 mg by twice daily on Tuesday, Thursday and Saturday  2   sucroferric oxyhydroxide (VELPHORO) 500 MG chewable tablet Chew 1,000 mg by mouth 3 (three) times daily with meals.      traMADol (ULTRAM) 50 MG tablet Take 1 tablet (50 mg total) by mouth every 6 (six) hours as needed. 10 tablet 0   No current facility-administered medications for this visit.    Review of Systems  Constitutional:  Constitutional negative. HENT: HENT negative.  Eyes: Eyes negative.  Respiratory: Respiratory negative.  Cardiovascular: Cardiovascular negative.  GI: Gastrointestinal negative.  Musculoskeletal: Musculoskeletal negative.  Skin: Skin negative.  Neurological: Neurological negative. Hematologic: Hematologic/lymphatic negative.  Psychiatric: Psychiatric negative.        Objective:  Objective   Vitals:   08/03/22 1020  BP: (!) 156/108  Pulse: 96  Resp: 20  Temp: 98 F (36.7 C)  SpO2: 96%     Physical Exam HENT:     Head: Normocephalic.     Nose: Nose normal.     Mouth/Throat:     Mouth: Mucous membranes are moist.  Cardiovascular:     Rate and Rhythm: Normal rate.  Pulmonary:     Effort: Pulmonary effort is normal.  Abdominal:     General: Abdomen is flat.  Musculoskeletal:     Cervical back: Normal range of motion and neck supple.     Right lower leg: No edema.     Left lower leg: No edema.  Skin:    General: Skin is warm and dry.     Capillary Refill: Capillary refill takes less than 2 seconds.  Neurological:     General: No focal  deficit present.     Mental Status: She is alert.  Psychiatric:        Mood and Affect: Mood normal.        Behavior: Behavior normal.        Thought Content: Thought content normal.     Data: Findings:  +--------------------+----------+-----------------+--------+  AVF                 PSV (cm/s)Flow Vol (mL/min)Comments  +--------------------+----------+-----------------+--------+  Native artery inflow   270          1041                 +--------------------+----------+-----------------+--------+  AVF Anastomosis        824                               +--------------------+----------+-----------------+--------+      +------------+----------+-------------+----------+----------------+  OUTFLOW VEINPSV (cm/s)Diameter (cm)Depth (cm)    Describe      +------------+----------+-------------+----------+----------------+  Prox UA        142        0.69        1.64   competing branch  +------------+----------+-------------+----------+----------------+  Mid UA         179        0.65        1.78                     +------------+----------+-------------+----------+----------------+  Dist UA        137        0.64        2.67                     +------------+----------+-------------+----------+----------------+  AC Fossa       567        0.92        1.94                     +------------+----------+-------------+----------+----------------+          Summary:  Arteriovenous fistula-Stenosis noted in the anastomosis.      Assessment/Plan:    76 year old female on dialysis via left sided tunneled catheter with a history of pacemaker and recently had balloon angioplasty of her central veins on the right for significant right upper extremity edema.  Fistula is now matured and edema has resolved.  As such we will plan for second stage basilic vein fistula on a nondialysis day in the near future.  Eliquis will need to be held for 48 hours prior to  procedure.     Waynetta Sandy MD [facility]

## 2022-08-05 ENCOUNTER — Other Ambulatory Visit: Payer: Self-pay

## 2022-08-05 DIAGNOSIS — N186 End stage renal disease: Secondary | ICD-10-CM

## 2022-09-01 NOTE — Progress Notes (Addendum)
Called pt for pre-op call. Call went to voicemail. Left pre-op instructions on pt's voicemail.   Pt has a pacemaker. I spoke with Joey with [employer]. He states pt will need a magnet only. No rep is needed.

## 2022-09-02 ENCOUNTER — Other Ambulatory Visit: Payer: Self-pay

## 2022-09-02 ENCOUNTER — Encounter (HOSPITAL_COMMUNITY)
Admission: RE | Disposition: A | Discharge status: Home / Self Care | Payer: Self-pay | Source: Home / Self Care | Attending: Vascular Surgery

## 2022-09-02 ENCOUNTER — Ambulatory Visit (HOSPITAL_COMMUNITY)
Admission: RE | Admit: 2022-09-02 | Discharge: 2022-09-02 | Disposition: A | Discharge status: Home / Self Care | Payer: Medicare Other | Attending: Vascular Surgery | Admitting: Vascular Surgery

## 2022-09-02 ENCOUNTER — Ambulatory Visit (HOSPITAL_COMMUNITY): Payer: Medicare Other | Admitting: Certified Registered Nurse Anesthetist

## 2022-09-02 ENCOUNTER — Ambulatory Visit (HOSPITAL_BASED_OUTPATIENT_CLINIC_OR_DEPARTMENT_OTHER): Payer: Medicare Other | Admitting: Certified Registered Nurse Anesthetist

## 2022-09-02 DIAGNOSIS — Z950 Presence of cardiac pacemaker: Secondary | ICD-10-CM | POA: Diagnosis not present

## 2022-09-02 DIAGNOSIS — I130 Hypertensive heart and chronic kidney disease with heart failure and stage 1 through stage 4 chronic kidney disease, or unspecified chronic kidney disease: Secondary | ICD-10-CM | POA: Insufficient documentation

## 2022-09-02 DIAGNOSIS — Z87891 Personal history of nicotine dependence: Secondary | ICD-10-CM | POA: Insufficient documentation

## 2022-09-02 DIAGNOSIS — E1151 Type 2 diabetes mellitus with diabetic peripheral angiopathy without gangrene: Secondary | ICD-10-CM | POA: Insufficient documentation

## 2022-09-02 DIAGNOSIS — I132 Hypertensive heart and chronic kidney disease with heart failure and with stage 5 chronic kidney disease, or end stage renal disease: Secondary | ICD-10-CM | POA: Diagnosis not present

## 2022-09-02 DIAGNOSIS — E1122 Type 2 diabetes mellitus with diabetic chronic kidney disease: Secondary | ICD-10-CM

## 2022-09-02 DIAGNOSIS — Z7901 Long term (current) use of anticoagulants: Secondary | ICD-10-CM | POA: Insufficient documentation

## 2022-09-02 DIAGNOSIS — N186 End stage renal disease: Secondary | ICD-10-CM | POA: Insufficient documentation

## 2022-09-02 DIAGNOSIS — N189 Chronic kidney disease, unspecified: Secondary | ICD-10-CM | POA: Diagnosis not present

## 2022-09-02 DIAGNOSIS — I509 Heart failure, unspecified: Secondary | ICD-10-CM | POA: Diagnosis not present

## 2022-09-02 DIAGNOSIS — N185 Chronic kidney disease, stage 5: Secondary | ICD-10-CM | POA: Diagnosis not present

## 2022-09-02 DIAGNOSIS — G4730 Sleep apnea, unspecified: Secondary | ICD-10-CM | POA: Insufficient documentation

## 2022-09-02 HISTORY — PX: BASCILIC VEIN TRANSPOSITION: SHX5742

## 2022-09-02 LAB — POCT I-STAT, CHEM 8
BUN: 54 mg/dL — ABNORMAL HIGH (ref 8–23)
BUN: 34 mg/dL — ABNORMAL HIGH (ref 8–23)
Calcium, Ion: 0.93 mmol/L — ABNORMAL LOW (ref 1.15–1.40)
Calcium, Ion: 1.13 mmol/L — ABNORMAL LOW (ref 1.15–1.40)
Chloride: 103 mmol/L (ref 98–111)
Chloride: 100 mmol/L (ref 98–111)
Creatinine, Ser: 10.2 mg/dL — ABNORMAL HIGH (ref 0.44–1.00)
Creatinine, Ser: 10.6 mg/dL — ABNORMAL HIGH (ref 0.44–1.00)
Glucose, Bld: 88 mg/dL (ref 70–99)
Glucose, Bld: 131 mg/dL — ABNORMAL HIGH (ref 70–99)
HCT: 44 % (ref 36.0–46.0)
HCT: 44 % (ref 36.0–46.0)
Hemoglobin: 15 g/dL (ref 12.0–15.0)
Hemoglobin: 15 g/dL (ref 12.0–15.0)
Potassium: 7.7 mmol/L (ref 3.5–5.1)
Potassium: 4 mmol/L (ref 3.5–5.1)
Sodium: 134 mmol/L — ABNORMAL LOW (ref 135–145)
Sodium: 138 mmol/L (ref 135–145)
TCO2: 28 mmol/L (ref 22–32)
TCO2: 26 mmol/L (ref 22–32)

## 2022-09-02 LAB — BASIC METABOLIC PANEL
Anion gap: 13 (ref 5–15)
BUN: 32 mg/dL — ABNORMAL HIGH (ref 8–23)
CO2: 26 mmol/L (ref 22–32)
Calcium: 9.7 mg/dL (ref 8.9–10.3)
Chloride: 99 mmol/L (ref 98–111)
Creatinine, Ser: 9.56 mg/dL — ABNORMAL HIGH (ref 0.44–1.00)
GFR, Estimated: 4 mL/min — ABNORMAL LOW (ref >60)
Glucose, Bld: 133 mg/dL — ABNORMAL HIGH (ref 70–99)
Potassium: 4.1 mmol/L (ref 3.5–5.1)
Sodium: 138 mmol/L (ref 135–145)

## 2022-09-02 LAB — GLUCOSE, CAPILLARY
Glucose-Capillary: 71 mg/dL (ref 70–99)
Glucose-Capillary: 96 mg/dL (ref 70–99)
Glucose-Capillary: 86 mg/dL (ref 70–99)

## 2022-09-02 SURGERY — TRANSPOSITION, VEIN, BASILIC
Anesthesia: General | Site: Arm Upper | Laterality: Right

## 2022-09-02 MED ORDER — PROPOFOL 10 MG/ML IV BOLUS
INTRAVENOUS | Status: DC | PRN
  Administered 2022-09-02: 140 mg via INTRAVENOUS

## 2022-09-02 MED ORDER — FENTANYL CITRATE (PF) 250 MCG/5ML IJ SOLN
INTRAMUSCULAR | Status: DC | PRN
  Administered 2022-09-02 (×2): 50 ug via INTRAVENOUS

## 2022-09-02 MED ORDER — CHLORHEXIDINE GLUCONATE 4 % EX LIQD: 60.0000 mL | Freq: Once | CUTANEOUS | Status: DC

## 2022-09-02 MED ORDER — LIDOCAINE 2% (20 MG/ML) 5 ML SYRINGE
INTRAMUSCULAR | Status: DC | PRN
  Administered 2022-09-02: 60 mg via INTRAVENOUS

## 2022-09-02 MED ORDER — FENTANYL CITRATE (PF) 100 MCG/2ML IJ SOLN
INTRAMUSCULAR | Status: AC
  Filled 2022-09-02: qty 2

## 2022-09-02 MED ORDER — TRAMADOL HCL 50 MG PO TABS: 50.0000 mg | ORAL_TABLET | Freq: Four times a day (QID) | ORAL | 0 refills | Status: AC | PRN

## 2022-09-02 MED ORDER — ORAL CARE MOUTH RINSE: 15.0000 mL | Freq: Once | OROMUCOSAL | Status: AC

## 2022-09-02 MED ORDER — 0.9 % SODIUM CHLORIDE (POUR BTL) OPTIME
TOPICAL | Status: DC | PRN
  Administered 2022-09-02: 500 mL

## 2022-09-02 MED ORDER — HEPARIN 6000 UNIT IRRIGATION SOLUTION
Status: DC | PRN
  Administered 2022-09-02: 1

## 2022-09-02 MED ORDER — PROTAMINE SULFATE 10 MG/ML IV SOLN
INTRAVENOUS | Status: AC
  Filled 2022-09-02: qty 5

## 2022-09-02 MED ORDER — ONDANSETRON HCL 4 MG/2ML IJ SOLN
INTRAMUSCULAR | Status: AC
  Filled 2022-09-02: qty 4

## 2022-09-02 MED ORDER — PHENYLEPHRINE 80 MCG/ML (10ML) SYRINGE FOR IV PUSH (FOR BLOOD PRESSURE SUPPORT)
PREFILLED_SYRINGE | INTRAVENOUS | Status: AC
  Filled 2022-09-02: qty 30

## 2022-09-02 MED ORDER — PHENYLEPHRINE 80 MCG/ML (10ML) SYRINGE FOR IV PUSH (FOR BLOOD PRESSURE SUPPORT)
PREFILLED_SYRINGE | INTRAVENOUS | Status: DC | PRN
  Administered 2022-09-02: 160 ug via INTRAVENOUS
  Administered 2022-09-02: 80 ug via INTRAVENOUS

## 2022-09-02 MED ORDER — ALBUTEROL SULFATE HFA 108 (90 BASE) MCG/ACT IN AERS
INHALATION_SPRAY | RESPIRATORY_TRACT | Status: AC
  Filled 2022-09-02: qty 13.4

## 2022-09-02 MED ORDER — AMISULPRIDE (ANTIEMETIC) 5 MG/2ML IV SOLN
5.0000 mg | Freq: Once | INTRAVENOUS | Status: AC
  Administered 2022-09-02: 5 mg via INTRAVENOUS

## 2022-09-02 MED ORDER — LIDOCAINE 2% (20 MG/ML) 5 ML SYRINGE
INTRAMUSCULAR | Status: AC
  Filled 2022-09-02: qty 5

## 2022-09-02 MED ORDER — ONDANSETRON HCL 4 MG/2ML IJ SOLN
INTRAMUSCULAR | Status: DC | PRN
  Administered 2022-09-02: 4 mg via INTRAVENOUS

## 2022-09-02 MED ORDER — CHLORHEXIDINE GLUCONATE 0.12 % MT SOLN
15.0000 mL | Freq: Once | OROMUCOSAL | Status: AC
  Administered 2022-09-02: 15 mL via OROMUCOSAL
  Filled 2022-09-02: qty 15

## 2022-09-02 MED ORDER — FENTANYL CITRATE (PF) 100 MCG/2ML IJ SOLN
25.0000 ug | INTRAMUSCULAR | Status: DC | PRN
  Administered 2022-09-02: 25 ug via INTRAVENOUS

## 2022-09-02 MED ORDER — PHENYLEPHRINE HCL-NACL 20-0.9 MG/250ML-% IV SOLN
INTRAVENOUS | Status: DC | PRN
  Administered 2022-09-02: 40 ug/min via INTRAVENOUS

## 2022-09-02 MED ORDER — DEXAMETHASONE SODIUM PHOSPHATE 10 MG/ML IJ SOLN
INTRAMUSCULAR | Status: DC | PRN
  Administered 2022-09-02: 10 mg via INTRAVENOUS

## 2022-09-02 MED ORDER — HEPARIN 6000 UNIT IRRIGATION SOLUTION
Status: AC
  Filled 2022-09-02: qty 500

## 2022-09-02 MED ORDER — SODIUM CHLORIDE 0.9 % IV SOLN: INTRAVENOUS | Status: DC

## 2022-09-02 MED ORDER — PROPOFOL 500 MG/50ML IV EMUL
INTRAVENOUS | Status: DC | PRN
  Administered 2022-09-02: 25 ug/kg/min via INTRAVENOUS

## 2022-09-02 MED ORDER — DEXAMETHASONE SODIUM PHOSPHATE 10 MG/ML IJ SOLN
INTRAMUSCULAR | Status: AC
  Filled 2022-09-02: qty 1

## 2022-09-02 MED ORDER — VANCOMYCIN HCL IN DEXTROSE 1-5 GM/200ML-% IV SOLN
1000.0000 mg | INTRAVENOUS | Status: AC
  Administered 2022-09-02: 1000 mg via INTRAVENOUS
  Filled 2022-09-02: qty 200

## 2022-09-02 MED ORDER — STERILE WATER FOR IRRIGATION IR SOLN
Status: DC | PRN
  Administered 2022-09-02: 1000 mL

## 2022-09-02 MED ORDER — AMISULPRIDE (ANTIEMETIC) 5 MG/2ML IV SOLN
INTRAVENOUS | Status: AC
  Filled 2022-09-02: qty 2

## 2022-09-02 MED ORDER — FENTANYL CITRATE (PF) 250 MCG/5ML IJ SOLN
INTRAMUSCULAR | Status: AC
  Filled 2022-09-02: qty 5

## 2022-09-02 MED ORDER — EPHEDRINE 5 MG/ML INJ
INTRAVENOUS | Status: AC
  Filled 2022-09-02: qty 5

## 2022-09-02 SURGICAL SUPPLY — 33 items
ARMBAND PINK RESTRICT EXTREMIT (MISCELLANEOUS) ×1 IMPLANT
BAG COUNTER SPONGE SURGICOUNT (BAG) ×1 IMPLANT
BNDG ELASTIC 6X15 VLCR STRL LF (GAUZE/BANDAGES/DRESSINGS) IMPLANT
CANISTER SUCT 3000ML PPV (MISCELLANEOUS) ×1 IMPLANT
CLIP LIGATING EXTRA MED SLVR (CLIP) ×1 IMPLANT
CLIP LIGATING EXTRA SM BLUE (MISCELLANEOUS) ×1 IMPLANT
COVER PROBE W GEL 5X96 (DRAPES) ×1 IMPLANT
DERMABOND ADVANCED .7 DNX12 (GAUZE/BANDAGES/DRESSINGS) ×1 IMPLANT
ELECT REM PT RETURN 9FT ADLT (ELECTROSURGICAL) ×1
ELECTRODE REM PT RTRN 9FT ADLT (ELECTROSURGICAL) ×1 IMPLANT
GLOVE BIO SURGEON STRL SZ7.5 (GLOVE) ×1 IMPLANT
GOWN STRL REUS W/ TWL LRG LVL3 (GOWN DISPOSABLE) ×2 IMPLANT
GOWN STRL REUS W/ TWL XL LVL3 (GOWN DISPOSABLE) ×1 IMPLANT
GOWN STRL REUS W/TWL LRG LVL3 (GOWN DISPOSABLE) ×3
GOWN STRL REUS W/TWL XL LVL3 (GOWN DISPOSABLE) ×1
KIT BASIN OR (CUSTOM PROCEDURE TRAY) ×1 IMPLANT
KIT TURNOVER KIT B (KITS) ×1 IMPLANT
LOOP VESSEL MINI RED (MISCELLANEOUS) IMPLANT
NS IRRIG 1000ML POUR BTL (IV SOLUTION) ×1 IMPLANT
PACK CV ACCESS (CUSTOM PROCEDURE TRAY) ×1 IMPLANT
PAD ARMBOARD 7.5X6 YLW CONV (MISCELLANEOUS) ×2 IMPLANT
POWDER SURGICEL 3.0 GRAM (HEMOSTASIS) IMPLANT
SPONGE T-LAP 18X18 ~~LOC~~+RFID (SPONGE) IMPLANT
SUT MNCRL AB 4-0 PS2 18 (SUTURE) ×1 IMPLANT
SUT PROLENE 5 0 C 1 24 (SUTURE) IMPLANT
SUT PROLENE 6 0 BV (SUTURE) ×1 IMPLANT
SUT SILK 2 0 PERMA HAND 18 BK (SUTURE) IMPLANT
SUT SILK 2 0 SH (SUTURE) IMPLANT
SUT VIC AB 3-0 SH 27 (SUTURE) ×3
SUT VIC AB 3-0 SH 27X BRD (SUTURE) ×1 IMPLANT
TOWEL GREEN STERILE (TOWEL DISPOSABLE) ×1 IMPLANT
UNDERPAD 30X36 HEAVY ABSORB (UNDERPADS AND DIAPERS) ×1 IMPLANT
WATER STERILE IRR 1000ML POUR (IV SOLUTION) ×1 IMPLANT

## 2022-09-02 NOTE — Anesthesia Preprocedure Evaluation (Signed)
Anesthesia Evaluation  Patient identified by MRN, date of birth, ID band Patient awake    Reviewed: Allergy & Precautions, NPO status , Patient's Chart, lab work & pertinent test results  Airway Mallampati: II       Dental   Pulmonary shortness of breath, sleep apnea , former smoker   breath sounds clear to auscultation       Cardiovascular hypertension, + Peripheral Vascular Disease and +CHF  + dysrhythmias + pacemaker  Rhythm:Regular Rate:Normal     Neuro/Psych  Neuromuscular disease CVA    GI/Hepatic negative GI ROS, Neg liver ROS,,,  Endo/Other  diabetes    Renal/GU Renal disease     Musculoskeletal  (+) Arthritis ,    Abdominal   Peds  Hematology   Anesthesia Other Findings   Reproductive/Obstetrics                             Anesthesia Physical Anesthesia Plan  ASA: 3  Anesthesia Plan: General   Post-op Pain Management:    Induction: Intravenous  PONV Risk Score and Plan: 3 and Ondansetron, Dexamethasone, Midazolam and Treatment may vary due to age or medical condition  Airway Management Planned: LMA  Additional Equipment:   Intra-op Plan:   Post-operative Plan:   Informed Consent: I have reviewed the patients History and Physical, chart, labs and discussed the procedure including the risks, benefits and alternatives for the proposed anesthesia with the patient or authorized representative who has indicated his/her understanding and acceptance.     Dental advisory given  Plan Discussed with: CRNA and Anesthesiologist  Anesthesia Plan Comments:        Anesthesia Quick Evaluation

## 2022-09-02 NOTE — Discharge Instructions (Addendum)
[facility]  Discharge Instructions  AV Fistula or Graft Surgery for Dialysis Access  Please refer to the following instructions for your post-procedure care. Your surgeon or physician assistant will discuss any changes with you.  Activity  You may drive the day following your surgery, if you are comfortable and no longer taking prescription pain medication. Resume full activity as the soreness in your incision resolves.  Bathing/Showering  You may shower after you go home. Keep your incision dry for 48 hours. Do not soak in a bathtub, hot tub, or swim until the incision heals completely. You may not shower if you have a hemodialysis catheter.  Incision Care  Clean your incision with mild soap and water after 48 hours. Pat the area dry with a clean towel. You do not need a bandage unless otherwise instructed. Do not apply any ointments or creams to your incision. You may have skin glue on your incision. Do not peel it off. It will come off on its own in about one week. Your arm may swell a bit after surgery. To reduce swelling use pillows to elevate your arm so it is above your heart. Your doctor will tell you if you need to lightly wrap your arm with an ACE bandage. Leave ace wrap on for 24-48 hours  Diet  Resume your normal diet. There are not special food restrictions following this procedure. In order to heal from your surgery, it is CRITICAL to get adequate nutrition. Your body requires vitamins, minerals, and protein. Vegetables are the best source of vitamins and minerals. Vegetables also provide the perfect balance of protein. Processed food has little nutritional value, so try to avoid this.  Medications  Resume taking all of your medications. If your incision is causing pain, you may take over-the counter pain relievers such as acetaminophen (Tylenol). If you were prescribed a stronger pain medication, please be aware these medications can cause  nausea and constipation. Prevent nausea by taking the medication with a snack or meal. Avoid constipation by drinking plenty of fluids and eating foods with high amount of fiber, such as fruits, vegetables, and grains.  Do not take Tylenol if you are taking prescription pain medications.  RESTART TAKING ELIQUIS ON 09/04/2022.  Follow up Your surgeon may want to see you in the office following your access surgery. If so, this will be arranged at the time of your surgery.  Please call us immediately for any of the following conditions:  Increased pain, redness, drainage (pus) from your incision site Fever of 101 degrees or higher Severe or worsening pain at your incision site Hand pain or numbness.  Reduce your risk of vascular disease:  Stop smoking. If you would like help, call [facility] at 1-800-QUIT-NOW 5814232523) or [place] at [place] your cholesterol Maintain a desired weight Control your diabetes Keep your blood pressure down  Dialysis  It will take several weeks to several months for your new dialysis access to be ready for use. Your surgeon will determine when it is okay to use it. Your nephrologist will continue to direct your dialysis. You can continue to use your Permcath until your new access is ready for use.   09/02/2022 JACQUELI PANGALLO [id number] 07-17-1946  Surgeon(s): Waynetta Sandy, MD  Procedure(s): RIGHT ARM SECOND STAGE BASILIC VEIN ARTERIOVENOUS FISTULA  x Do not stick fistula for 8 weeks    If you have any questions, please call the office at [phone].

## 2022-09-02 NOTE — Anesthesia Procedure Notes (Addendum)
Procedure Name: LMA Insertion Date/Time: 09/02/2022 4:35 PM  Performed by: Minerva Ends, CRNAPre-anesthesia Checklist: Patient identified, Emergency Drugs available, Suction available and Patient being monitored Patient Re-evaluated:Patient Re-evaluated prior to induction Oxygen Delivery Method: Circle system utilized Preoxygenation: Pre-oxygenation with 100% oxygen Induction Type: IV induction Ventilation: Mask ventilation without difficulty LMA: LMA inserted LMA Size: 4.0 Tube type: Oral Number of attempts: 1 Placement Confirmation: positive ETCO2 and breath sounds checked- equal and bilateral Tube secured with: Tape Dental Injury: Teeth and Oropharynx as per pre-operative assessment

## 2022-09-02 NOTE — Progress Notes (Signed)
Dr. Nyoka Cowden made aware that ISTAT resulted with a potassium of 7.7. Per Dr. Nyoka Cowden redraw ISTAT and send tube down for a BMP.

## 2022-09-02 NOTE — Op Note (Signed)
    Patient name: Stacy Hammond MRN: [id number] DOB: May 10, 1946 Sex: female  09/02/2022 Pre-operative Diagnosis: End-stage renal disease Post-operative diagnosis:  Same Surgeon:  Erlene Quan C. Donzetta Matters, MD Assistant: Leontine Locket, PA Procedure Performed:  Right arm basilic vein fistula revision with transposition   Indications: 76 year old female on dialysis via catheter.  She has a previous for stage basilic vein fistula created underwent fistulogram to balloon central stenosis now indicated for second stage procedure.  Findings: Patient was very large throughout the course measuring approximately 1 cm.  This was tunneled anteriorly on the upper arm and had very strong flow at completion there was a radial artery signal at the wrist that augmented with compression of the fistula completion.   Procedure:  The patient was identified in the holding area and taken to the operating room where she is placed supine operative table and LMA anesthesia induced.  She was gently prepped and draped in the right upper extremity usual fashion, antibiotics were minister timeout was called.  Ultrasound was used to identify the fistula throughout the upper arm and 3 separate incisions were made to the upper arm the fistula was dissected out throughout the entirety.  The nerve was protected throughout its course branches were divided between clips and ties.  The vein was marked for orientation clamped near the anastomosis transected tunneled laterally flushed with heparinized saline and sewn into and after spatulated both sides with 6-0 Prolene suture.  Upon completion we had a very strong thrill in the fistula that was traced with Doppler and radial artery signal at the wrist that was augmented with compression of the fistula.  Satisfied with this we irrigated the wounds obtain hemostasis closed in layers of Vicryl Monocryl and Dermabond was placed at the skin level.  She was awakened from anesthesia having tolerated  procedure without any complication.   Given the complexity of the case,  the assistant was necessary in order to expedient the procedure and safely perform the technical aspects of the operation.  The assistant provided traction and countertraction to assist with exposure of the artery and vein.  They also assisted with suture ligation of multiple venous branches.  They played a critical role in the anastomosis. These skills, especially following the Prolene suture for the anastomosis, could not have been adequately performed by a scrub tech assistant.   EBL: 100 cc     Wash Nienhaus C. Donzetta Matters, MD [facility] Office: [phone] Pager: [phone]

## 2022-09-02 NOTE — Transfer of Care (Signed)
Immediate Anesthesia Transfer of Care Note  Patient: Stacy Hammond  Procedure(s) Performed: RIGHT ARM SECOND STAGE BASILIC VEIN ARTERIOVENOUS FISTULA (Right: Arm Upper)  Patient Location: PACU  Anesthesia Type:General  Level of Consciousness: awake, alert , and oriented  Airway & Oxygen Therapy: Patient Spontanous Breathing  Post-op Assessment: Report given to RN and Post -op Vital signs reviewed and stable  Post vital signs: Reviewed and stable  Last Vitals:  Vitals Value Taken Time  BP 123/78 09/02/22 1751  Temp    Pulse 80 09/02/22 1753  Resp 15 09/02/22 1753  SpO2 95 % 09/02/22 1753  Vitals shown include unvalidated device data.  Last Pain:  Vitals:   09/02/22 1324  TempSrc:   PainSc: 0-No pain         Complications: No notable events documented.

## 2022-09-02 NOTE — Interval H&P Note (Signed)
History and Physical Interval Note:  09/02/2022 2:07 PM  [place]  has presented today for surgery, with the diagnosis of ESRD.  The various methods of treatment have been discussed with the patient and family. After consideration of risks, benefits and other options for treatment, the patient has consented to  Procedure(s): RIGHT ARM SECOND STAGE BASILIC VEIN ARTERIOVENOUS FISTULA (Right) as a surgical intervention.  The patient's history has been reviewed, patient examined, no change in status, stable for surgery.  I have reviewed the patient's chart and labs.  Questions were answered to the patient's satisfaction.     Servando Snare

## 2022-09-03 NOTE — Anesthesia Postprocedure Evaluation (Signed)
Anesthesia Post Note  Patient: Stacy Hammond  Procedure(s) Performed: RIGHT ARM SECOND STAGE BASILIC VEIN ARTERIOVENOUS FISTULA (Right: Arm Upper)     Patient location during evaluation: PACU Anesthesia Type: General Level of consciousness: awake and alert Pain management: pain level controlled Vital Signs Assessment: post-procedure vital signs reviewed and stable Respiratory status: spontaneous breathing, nonlabored ventilation and respiratory function stable Cardiovascular status: stable and blood pressure returned to baseline Anesthetic complications: no   No notable events documented.  Last Vitals:  Vitals:   09/02/22 1830 09/02/22 1845  BP: 98/73 111/72  Pulse: 76 79  Resp: 15 18  Temp:  36.7 C  SpO2: 97% 97%    Last Pain:  Vitals:   09/02/22 1845  TempSrc:   PainSc: [place]

## 2022-09-04 ENCOUNTER — Encounter (HOSPITAL_COMMUNITY): Payer: Self-pay | Admitting: Vascular Surgery

## 2022-09-05 ENCOUNTER — Telehealth: Payer: Self-pay

## 2022-09-05 NOTE — Telephone Encounter (Signed)
Pt's daughter, Denman George, called stating that the pt had a procedure on 12/1 and restarted her Eliquis yesterday. She was now having bleeding and requested her arm to be re-wrapped in office today.  Reviewed pt's chart, returned call for clarification, two identifiers used. Denman George stated that the dressing was removed and she thinks that part or all of the skin glue may have come off with it. She stated that the bleeding is very slight. Asked if she felt comfortable re-wrapping it and she did. Gave her incision care guidelines and how to re-wrap. Instructed her to call if bleeding became worse or other symptoms arose. Confirmed understanding.

## 2022-09-12 ENCOUNTER — Ambulatory Visit (INDEPENDENT_AMBULATORY_CARE_PROVIDER_SITE_OTHER): Payer: Medicare Other | Admitting: Physician Assistant

## 2022-09-12 ENCOUNTER — Telehealth: Payer: Self-pay

## 2022-09-12 VITALS — BP 148/74 | HR 82 | Temp 97.6°F | Resp 14 | Ht 64.0 in | Wt 185.0 lb

## 2022-09-12 DIAGNOSIS — N186 End stage renal disease: Secondary | ICD-10-CM

## 2022-09-12 DIAGNOSIS — Z992 Dependence on renal dialysis: Secondary | ICD-10-CM

## 2022-09-12 MED ORDER — DOXYCYCLINE HYCLATE 50 MG PO CAPS: 50.0000 mg | ORAL_CAPSULE | Freq: Two times a day (BID) | ORAL | 0 refills | Status: DC

## 2022-09-12 NOTE — Progress Notes (Signed)
POST OPERATIVE OFFICE NOTE    CC:  F/u for surgery  HPI:  Stacy Hammond is a 76 y.o. female who is s/p right second stage basilic vein fistula on 05/08/7618 by Dr. Donzetta Matters.  Her first stage procedure was on 04/01/2022, however she developed right arm swelling and required balloon angioplasty of the fistula on 06/27/2022.  Pt returns today for follow up.  Pt states her most distal right arm incision has been draining clear/whitish fluid for the past 5 days.  She also noticed that the incision in her armpit has been draining some blood and clear fluid and opened up a little bit a couple days ago.  She has any puslike discharge, foul-smelling discharge, fevers, chills.  She has been keeping small dry gauze dressings on the incisions, however she has to change the gauze 2-3 daily after it saturates.  She currently dialyzes on Tuesdays, Thursdays, and Saturdays at [place] location. Allergies  Allergen Reactions   Penicillins Hives    Has patient had a PCN reaction causing immediate rash, facial/tongue/throat swelling, SOB or lightheadedness with hypotension: No Has patient had a PCN reaction causing severe rash involving mucus membranes or skin necrosis: No Has patient had a PCN reaction that required hospitalization: No Has patient had a PCN reaction occurring within the last 10 years: Yes If all of the above answers are "NO", then may proceed with Cephalosporin use.    Oxycodone Nausea And Vomiting    Current Outpatient Medications  Medication Sig Dispense Refill   acetaminophen (TYLENOL) 500 MG tablet Take 1,000 mg by mouth every 6 (six) hours as needed for headache or moderate pain.     amiodarone (PACERONE) 100 MG tablet Take 100 mg by mouth daily.     apixaban (ELIQUIS) 2.5 MG TABS tablet Take 2.5 mg by mouth 2 (two) times daily.     atorvastatin (LIPITOR) 40 MG tablet Take 40 mg by mouth daily.     Ketotifen Fumarate (ALLERGY EYE DROPS OP) Place 1 drop into both eyes daily as needed  (allergies).     Menthol, Topical Analgesic, (BIOFREEZE EX) Apply 1 Application topically daily as needed (pain).     midodrine (PROAMATINE) 10 MG tablet Take 10 mg by mouth See admin instructions. Take 10 mg by twice daily on Tuesday, Thursday and Saturday  2   sucroferric oxyhydroxide (VELPHORO) 500 MG chewable tablet Chew 1,000 mg by mouth 3 (three) times daily with meals.      traMADol (ULTRAM) 50 MG tablet Take 1 tablet (50 mg total) by mouth every 6 (six) hours as needed. 20 tablet 0   No current facility-administered medications for this visit.     ROS:  See HPI  Physical Exam:  Incision: Right axillary incision with small area of dehiscence centrally centrally about 1 cm in circumference with healthy appearing tissue. The most distal right upper extremity incision has a small amount of clear drainage coming from the edge, however there is no dehiscence or signs of infection Extremities: Palpable right radial pulse.  Mild bruising of right upper extremity.  Right upper extremity fistula with good thrill on palpation Neuro: Upper extremity motor 3/5.  Sensation 5/5    Assessment/Plan:  This is a 76 y.o. female who is s/p: Right second stage basilic fistula on 50/06/3266 by Dr. Donzetta Matters   -The patient's distal upper extremity incisions have healed well without dehiscence or infection.  The incision at the axilla has a small area of dehiscence with healthy appearing tissue.  There  is minimal bleeding from this wound. -The most distal incision has had some white/clear drainage from it.  The patient likely has developed a seroma postoperatively, which is now draining. -At this time I am hopeful that the patient's seroma will resolve with dry gauze dressing changes as needed and light ace wrap compression.  I believe that her axilla incision will heal with daily wet-to-dry dressing changes -Her right basilic fistula has a palpable thrill.  She does have some right upper extremity weakness since  surgery, which will hopefully resolve -I will send out a 7 day course of doxycycline as a prophylactic since she is allergic to penicillins -She will follow-up with our office in 1 week with repeat wound check   Vicente Serene, PA-C [facility] [phone]   Clinic MD: Trula Slade

## 2022-09-12 NOTE — Telephone Encounter (Signed)
Pt's daughter, Denman George, called requesting help re-wrapping pt's arm. She stated there was a hole near the pt's armpit where the incision was and it's draining white, odorless fluid all the way down her arm. She is having to change the bandage every 2 hours because "it is like a faucet". Appt scheduled with PA today d/t symptoms. Confirmed understanding.

## 2022-09-19 ENCOUNTER — Ambulatory Visit: Payer: Medicare Other

## 2022-09-19 ENCOUNTER — Telehealth: Payer: Self-pay | Admitting: *Deleted

## 2022-09-19 NOTE — Telephone Encounter (Signed)
Patient's daughter called and states patient is only taking her eliquis once daily instead of twice daily as ordered due to bleeding from underarm incision. Explained importance of taking medication as order to avoid her dialysis access clotting off. Patient has follow up appt 09/21/22 and Denman George stated they will discuss this with the provider at that time. She states she was just calling to let us know.

## 2022-09-20 NOTE — Progress Notes (Unsigned)
  POST OPERATIVE OFFICE NOTE    CC:  F/u for surgery  HPI:  This is a 76 y.o. female who is s/p right second stage basilic vein fistula on 65/04/8468 by Dr. Donzetta Matters.  Her first stage procedure was on 04/01/2022, however she developed right arm swelling and required balloon angioplasty of the fistula on 06/27/2022. She then went on to develop drainage of a distal incision and the axillary incision.  She has been managing this with dry guaze.    On her last visit The patient's distal upper extremity incisions have healed well without dehiscence or infection. The incision at the axilla has a small area of dehiscence with healthy appearing tissue.  She was sent home with a 7 day course of doxycycline as a prophylactic since she is allergic to penicillins -She will follow-up with our office in 1 week with repeat wound check    She currently dialyzes on Tuesdays, Thursdays, and Saturdays at [place] location.   Allergies  Allergen Reactions   Penicillins Hives    Has patient had a PCN reaction causing immediate rash, facial/tongue/throat swelling, SOB or lightheadedness with hypotension: No Has patient had a PCN reaction causing severe rash involving mucus membranes or skin necrosis: No Has patient had a PCN reaction that required hospitalization: No Has patient had a PCN reaction occurring within the last 10 years: Yes If all of the above answers are "NO", then may proceed with Cephalosporin use.    Oxycodone Nausea And Vomiting    Current Outpatient Medications  Medication Sig Dispense Refill   acetaminophen (TYLENOL) 500 MG tablet Take 1,000 mg by mouth every 6 (six) hours as needed for headache or moderate pain.     amiodarone (PACERONE) 100 MG tablet Take 100 mg by mouth daily.     apixaban (ELIQUIS) 2.5 MG TABS tablet Take 2.5 mg by mouth 2 (two) times daily.     atorvastatin (LIPITOR) 40 MG tablet Take 40 mg by mouth daily.     doxycycline (VIBRAMYCIN) 50 MG capsule Take 1 capsule (50 mg  total) by mouth 2 (two) times daily. 14 capsule 0   Ketotifen Fumarate (ALLERGY EYE DROPS OP) Place 1 drop into both eyes daily as needed (allergies).     Menthol, Topical Analgesic, (BIOFREEZE EX) Apply 1 Application topically daily as needed (pain).     midodrine (PROAMATINE) 10 MG tablet Take 10 mg by mouth See admin instructions. Take 10 mg by twice daily on Tuesday, Thursday and Saturday  2   sucroferric oxyhydroxide (VELPHORO) 500 MG chewable tablet Chew 1,000 mg by mouth 3 (three) times daily with meals.      traMADol (ULTRAM) 50 MG tablet Take 1 tablet (50 mg total) by mouth every 6 (six) hours as needed. 20 tablet 0   No current facility-administered medications for this visit.     ROS:  See HPI  Physical Exam:  ***  Incision:  *** Extremities:  *** Neuro: *** Abdomen:  ***    Assessment/Plan:  This is a 76 y.o. female who is s/p: ***  -***   Roxy Horseman  [facility] [phone]   Clinic MD:  Donzetta Matters

## 2022-09-21 ENCOUNTER — Ambulatory Visit (INDEPENDENT_AMBULATORY_CARE_PROVIDER_SITE_OTHER): Payer: Medicare Other | Admitting: Physician Assistant

## 2022-09-21 VITALS — BP 147/79 | HR 79 | Temp 97.3°F | Resp 20 | Ht 64.0 in | Wt 182.7 lb

## 2022-09-21 DIAGNOSIS — Z992 Dependence on renal dialysis: Secondary | ICD-10-CM

## 2022-09-21 DIAGNOSIS — N186 End stage renal disease: Secondary | ICD-10-CM

## 2022-09-27 ENCOUNTER — Other Ambulatory Visit: Payer: Self-pay

## 2022-09-27 DIAGNOSIS — Z992 Dependence on renal dialysis: Secondary | ICD-10-CM

## 2022-10-19 ENCOUNTER — Ambulatory Visit (INDEPENDENT_AMBULATORY_CARE_PROVIDER_SITE_OTHER): Payer: 59 | Admitting: Physician Assistant

## 2022-10-19 ENCOUNTER — Ambulatory Visit (HOSPITAL_COMMUNITY)
Admission: RE | Admit: 2022-10-19 | Discharge: 2022-10-19 | Disposition: A | Discharge status: Home / Self Care | Payer: 59 | Source: Ambulatory Visit | Attending: Vascular Surgery | Admitting: Vascular Surgery

## 2022-10-19 VITALS — BP 124/79 | HR 101 | Temp 97.9°F | Resp 20 | Ht 64.0 in | Wt 182.0 lb

## 2022-10-19 DIAGNOSIS — Z992 Dependence on renal dialysis: Secondary | ICD-10-CM

## 2022-10-19 DIAGNOSIS — N186 End stage renal disease: Secondary | ICD-10-CM | POA: Diagnosis not present

## 2022-10-19 NOTE — Progress Notes (Signed)
  POST OPERATIVE OFFICE NOTE    CC:  F/u for surgery  HPI:  This is a 77 y.o. female who is s/p second stage basilic vein transposition of the right arm on 09/02/2022.  She returns to clinic to check incision in the axilla to make sure it healed.  She denies steal symptoms in her right hand.  She is dialyzing via left IJ [facility] on a Tuesday Thursday Saturday schedule in [place].  Allergies  Allergen Reactions   Penicillins Hives    Has patient had a PCN reaction causing immediate rash, facial/tongue/throat swelling, SOB or lightheadedness with hypotension: No Has patient had a PCN reaction causing severe rash involving mucus membranes or skin necrosis: No Has patient had a PCN reaction that required hospitalization: No Has patient had a PCN reaction occurring within the last 10 years: Yes If all of the above answers are "NO", then may proceed with Cephalosporin use.    Oxycodone Nausea And Vomiting    Current Outpatient Medications  Medication Sig Dispense Refill   acetaminophen (TYLENOL) 500 MG tablet Take 1,000 mg by mouth every 6 (six) hours as needed for headache or moderate pain.     amiodarone (PACERONE) 100 MG tablet Take 100 mg by mouth daily.     apixaban (ELIQUIS) 2.5 MG TABS tablet Take 2.5 mg by mouth 2 (two) times daily.     atorvastatin (LIPITOR) 40 MG tablet Take 40 mg by mouth daily.     doxycycline (VIBRAMYCIN) 50 MG capsule Take 1 capsule (50 mg total) by mouth 2 (two) times daily. 14 capsule 0   Ketotifen Fumarate (ALLERGY EYE DROPS OP) Place 1 drop into both eyes daily as needed (allergies).     Menthol, Topical Analgesic, (BIOFREEZE EX) Apply 1 Application topically daily as needed (pain).     midodrine (PROAMATINE) 10 MG tablet Take 10 mg by mouth See admin instructions. Take 10 mg by twice daily on Tuesday, Thursday and Saturday  2   sucroferric oxyhydroxide (VELPHORO) 500 MG chewable tablet Chew 1,000 mg by mouth 3 (three) times daily with meals.      traMADol  (ULTRAM) 50 MG tablet Take 1 tablet (50 mg total) by mouth every 6 (six) hours as needed. 20 tablet 0   No current facility-administered medications for this visit.     ROS:  See HPI  Physical Exam:  Vitals:   10/19/22 0920  BP: 124/79  Pulse: (!) 101  Resp: 20  Temp: 97.9 F (36.6 C)  SpO2: 100%  Weight: 182 lb (82.6 kg)  Height: '5\' 4"'$  (1.626 m)    Incision:  incisions are well healed right arm Extremities:  1+ R radial; palpable thrill throughout fistula  Assessment/Plan:  This is a 77 y.o. female who is s/p: 2nd stage basilic vein transposition  -Right axilla incision completely healed -Palpable thrill throughout upper arm despite low flow volume based on duplex -Okay to begin cannulating right arm fistula however patient would prefer to wait another 2 weeks due to the ongoing pain and sensitivity in the upper arm.  We will plan to have the [place] dialysis center cannulate the fistula starting 11/03/2022.  If flow rates are suboptimal she can be scheduled for a fistulogram without office visit.  She will follow-up on an as-needed basis.   Dagoberto Ligas, PA-C [facility] [phone]  Clinic MD:  Scot Dock

## 2022-10-26 ENCOUNTER — Ambulatory Visit: Payer: 59 | Admitting: Vascular Surgery

## 2022-11-23 ENCOUNTER — Other Ambulatory Visit: Payer: Self-pay

## 2022-11-23 ENCOUNTER — Ambulatory Visit (INDEPENDENT_AMBULATORY_CARE_PROVIDER_SITE_OTHER): Payer: 59 | Admitting: Vascular Surgery

## 2022-11-23 ENCOUNTER — Encounter: Payer: Self-pay | Admitting: Vascular Surgery

## 2022-11-23 VITALS — BP 119/75 | HR 89 | Temp 97.9°F | Resp 20 | Ht 64.0 in | Wt 180.0 lb

## 2022-11-23 DIAGNOSIS — N186 End stage renal disease: Secondary | ICD-10-CM

## 2022-11-23 MED ORDER — SODIUM CHLORIDE 0.9% FLUSH: 3.0000 mL | Freq: Two times a day (BID) | INTRAVENOUS | Status: DC

## 2022-11-23 MED ORDER — SODIUM CHLORIDE 0.9 % IV SOLN: 250.0000 mL | INTRAVENOUS | Status: DC | PRN

## 2022-11-23 NOTE — Progress Notes (Signed)
Patient ID: Stacy Hammond, female   DOB: 1946-02-12, 77 y.o.   MRN: [id number]  Reason for Consult: Follow-up   Referred by Justin Mend, MD  Subjective:     HPI:  Stacy Hammond is a 77 y.o. female history of end-stage renal disease currently on dialysis via left IJ catheter on Tuesdays, Thursdays and Saturdays.  She does take Eliquis.  Recently has had issues with her right arm basilic vein fistula.  They were able to cannulate it initially in January and more recently have been unable to cannulate.  She does not have any right hand issues.  There was consideration of peritoneal dialysis patient now considering this with previous abdominal hysterectomy and mass removal with lower abdominal incisions.  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 No pertinent family history. Past Surgical History:  Procedure Laterality Date   ABDOMINAL HYSTERECTOMY     partial   ANKLE FUSION Right 2014?   done at [place] Left 03/27/2020   Procedure: INSERTION OF LEFT UPPER ARM ARTERIOVENOUS (AV) GORE-TEX GRAFT ARM USING 4X7MM X 45 CM STRETCH GORETEX GRAFT;  Surgeon: Rosetta Posner, MD;  Location: [facility];  Service: Vascular;  Laterality: Left;   AV FISTULA PLACEMENT Right 04/01/2022   Procedure: RIGHT ARM  ARTERIOVENOUS (AV) FISTULA  CREATION;  Surgeon: Waynetta Sandy, MD;  Location: [facility];  Service: Vascular;  Laterality: Right;   [place] Right 09/02/2022   Procedure: RIGHT ARM SECOND STAGE BASILIC VEIN ARTERIOVENOUS FISTULA;  Surgeon: Waynetta Sandy, MD;  Location: [facility];  Service: Vascular;  Laterality: Right;   BREAST LUMPECTOMY Left    COLONOSCOPY W/ POLYPECTOMY     EYE SURGERY Bilateral    Cataract   FISTULA SUPERFICIALIZATION Left 06/01/2018   Procedure: FISTULA PLICATION LEFT ARM;  Surgeon: Serafina Mitchell, MD;  Location: [facility] OR;  Service: Vascular;  Laterality: Left;   INSERTION OF DIALYSIS CATHETER Left 03/27/2020   Procedure: INSERTION OF TUNNELED DIALYSIS CATHETER USING PALINDROME 23CM;  Surgeon: Rosetta Posner, MD;  Location[facility] OR;  Service: Vascular;  Laterality: Left;   PACEMAKER PLACEMENT  03/21/2015   [employer]   PERIPHERAL VASCULAR BALLOON ANGIOPLASTY Right 06/27/2022   Procedure: PERIPHERAL VASCULAR BALLOON ANGIOPLASTY;  Surgeon: Waynetta Sandy, MD;  Location: [facility];  Service: Cardiovascular;  Laterality: Right;   REVISON OF ARTERIOVENOUS FISTULA Left 09/14/2016   Procedure: LEFT CEPHALIC TURNDOWN;  Surgeon: Conrad Lake Morton-Berrydale, MD;  Location: [facility];  Service: Vascular;  Laterality: Left;    Short Social History:  Social History   Tobacco Use   Smoking status: Former    Years: 20.00    Types: Cigarettes    Quit date: 09/09/1986    Years since quitting: 36.2   Smokeless tobacco: Never  Substance Use Topics   Alcohol use: No    Allergies  Allergen Reactions   Penicillins Hives    Has patient had a PCN reaction causing immediate rash, facial/tongue/throat swelling, SOB or lightheadedness with hypotension: No Has patient had a PCN reaction causing severe rash involving mucus membranes or skin necrosis: No Has patient had a PCN reaction that required hospitalization: No Has patient had a PCN reaction  occurring within the last 10 years: Yes If all of the above answers are "NO", then may proceed with Cephalosporin use.    Oxycodone Nausea And Vomiting    Current Outpatient Medications  Medication Sig Dispense Refill   acetaminophen (TYLENOL) 500 MG tablet Take 1,000 mg by mouth every 6 (six) hours as needed for headache or moderate pain.     amiodarone (PACERONE) 100 MG tablet Take 100 mg by mouth daily.     apixaban (ELIQUIS) 2.5 MG TABS tablet Take 2.5 mg by mouth 2 (two) times daily.     atorvastatin (LIPITOR) 40 MG tablet Take 40 mg by mouth daily.     Ketotifen Fumarate (ALLERGY EYE DROPS OP) Place 1 drop into both eyes daily as needed (allergies).     Menthol, Topical Analgesic, (BIOFREEZE EX) Apply 1 Application topically daily as needed (pain).     midodrine (PROAMATINE) 10 MG tablet Take 10 mg by mouth See admin instructions. Take 10 mg by twice daily on Tuesday, Thursday and Saturday  2   sucroferric oxyhydroxide (VELPHORO) 500 MG chewable tablet Chew 1,000 mg by mouth 3 (three) times daily with meals.      traMADol (ULTRAM) 50 MG tablet Take 1 tablet (50 mg total) by mouth every 6 (six) hours as needed. 20 tablet 0   doxycycline (VIBRAMYCIN) 50 MG capsule Take 1 capsule (50 mg total) by mouth 2 (two) times daily. (Patient not taking: Reported on 11/23/2022) 14 capsule 0   No current facility-administered medications for this visit.    Review of Systems  Constitutional: Positive for fatigue.  HENT: HENT negative.  Eyes: Eyes negative.  GI: Gastrointestinal negative.  Musculoskeletal:       Nonfunctioning right arm fistula Skin: Skin negative.  Neurological: Neurological negative. Hematologic: Positive for bruises/bleeds easily.  Psychiatric: Psychiatric negative.        Objective:  Objective   Vitals:   11/23/22 0923  BP: 119/75  Pulse: 89  Resp: 20  Temp: 97.9 F (36.6 C)  SpO2: 95%  Weight: 180 lb (81.6 kg)  Height: 5' 4"$  (1.626 m)   Body mass index is  30.9 kg/m.  Physical Exam HENT:     Head: Normocephalic.     Mouth/Throat:     Mouth: Mucous membranes are moist.  Eyes:     Pupils: Pupils are equal, round, and reactive to light.  Cardiovascular:     Pulses:          Radial pulses are 2+ on the right side.  Abdominal:     General: Abdomen is flat.     Palpations: Abdomen is soft.  Musculoskeletal:     Comments: Pulsatility right arm AV fistula  Skin:    Capillary Refill: Capillary refill takes less than 2 seconds.  Neurological:     Mental Status: She is alert.  Psychiatric:        Mood and Affect: Mood normal.        Thought Content: Thought content normal.     Data: No studies     Assessment/Plan:     77 year old female with end-stage renal disease here for evaluation of her right arm AV fistula which is nonfunctional.  Plan will be for fistulogram on a nondialysis day in the near future.  She does dialyze Tuesdays, Thursdays and Saturdays.  Will need to hold Eliquis 2 days prior to the procedure.  We discussed the risk benefits alternatives she demonstrates good understanding having been through many of these on the left arm in the past.  All questions were answered in the presence of her daughter.     Waynetta Sandy MD [facility]

## 2022-11-23 NOTE — H&P (View-Only) (Signed)
Patient ID: Stacy Hammond, female   DOB: 11/16/1945, 77 y.o.   MRN: [id number]  Reason for Consult: Follow-up   Referred by Justin Mend, MD  Subjective:     HPI:  Stacy Hammond is a 77 y.o. female history of end-stage renal disease currently on dialysis via left IJ catheter on Tuesdays, Thursdays and Saturdays.  She does take Eliquis.  Recently has had issues with her right arm basilic vein fistula.  They were able to cannulate it initially in January and more recently have been unable to cannulate.  She does not have any right hand issues.  There was consideration of peritoneal dialysis patient now considering this with previous abdominal hysterectomy and mass removal with lower abdominal incisions.  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 No pertinent family history. Past Surgical History:  Procedure Laterality Date   ABDOMINAL HYSTERECTOMY     partial   ANKLE FUSION Right 2014?   done at [place] Left 03/27/2020   Procedure: INSERTION OF LEFT UPPER ARM ARTERIOVENOUS (AV) GORE-TEX GRAFT ARM USING 4X7MM X 45 CM STRETCH GORETEX GRAFT;  Surgeon: Rosetta Posner, MD;  Location: [place];  Service: Vascular;  Laterality: Left;   AV FISTULA PLACEMENT Right 04/01/2022   Procedure: RIGHT ARM  ARTERIOVENOUS (AV) FISTULA  CREATION;  Surgeon: Waynetta Sandy, MD;  Location: [place];  Service: Vascular;  Laterality: Right;   [place] Right 09/02/2022   Procedure: RIGHT ARM SECOND STAGE BASILIC VEIN ARTERIOVENOUS FISTULA;  Surgeon: Waynetta Sandy, MD;  Location: [place];  Service: Vascular;  Laterality: Right;   BREAST LUMPECTOMY Left    COLONOSCOPY W/ POLYPECTOMY     EYE SURGERY Bilateral    Cataract   FISTULA SUPERFICIALIZATION Left 06/01/2018   Procedure: FISTULA PLICATION LEFT ARM;  Surgeon: Serafina Mitchell, MD;  [facility];  Service: Vascular;  Laterality: Left;   INSERTION OF DIALYSIS CATHETER Left 03/27/2020   Procedure: INSERTION OF TUNNELED DIALYSIS CATHETER USING PALINDROME 23CM;  Surgeon: Rosetta Posner, MD;  Location: [facility] OR;  Service: Vascular;  Laterality: Left;   PACEMAKER PLACEMENT  03/21/2015   [employer]   PERIPHERAL VASCULAR BALLOON ANGIOPLASTY Right 06/27/2022   Procedure: PERIPHERAL VASCULAR BALLOON ANGIOPLASTY;  Surgeon: Waynetta Sandy, MD;  Location: [place] CV LAB;  Service: Cardiovascular;  Laterality: Right;   REVISON OF ARTERIOVENOUS FISTULA Left 09/14/2016   Procedure: LEFT CEPHALIC TURNDOWN;  Surgeon: Conrad Rosita, MD;  Location: [place];  Service: Vascular;  Laterality: Left;    Short Social History:  Social History   Tobacco Use   Smoking status: Former    Years: 20.00    Types: Cigarettes    Quit date: 09/09/1986    Years since quitting: 36.2   Smokeless tobacco: Never  Substance Use Topics   Alcohol use: No    Allergies  Allergen Reactions   Penicillins Hives    Has patient had a PCN reaction causing immediate rash, facial/tongue/throat swelling, SOB or lightheadedness with hypotension: No Has patient had a PCN reaction causing severe rash involving mucus membranes or skin necrosis: No Has patient had a PCN reaction that required hospitalization: No Has patient had a PCN reaction  occurring within the last 10 years: Yes If all of the above answers are "NO", then may proceed with Cephalosporin use.    Oxycodone Nausea And Vomiting    Current Outpatient Medications  Medication Sig Dispense Refill   acetaminophen (TYLENOL) 500 MG tablet Take 1,000 mg by mouth every 6 (six) hours as needed for headache or moderate pain.     amiodarone (PACERONE) 100 MG tablet Take 100 mg by mouth daily.     apixaban (ELIQUIS) 2.5 MG TABS tablet Take 2.5 mg by mouth 2 (two) times daily.     atorvastatin (LIPITOR) 40 MG tablet Take 40 mg by mouth daily.     Ketotifen Fumarate (ALLERGY EYE DROPS OP) Place 1 drop into both eyes daily as needed (allergies).     Menthol, Topical Analgesic, (BIOFREEZE EX) Apply 1 Application topically daily as needed (pain).     midodrine (PROAMATINE) 10 MG tablet Take 10 mg by mouth See admin instructions. Take 10 mg by twice daily on Tuesday, Thursday and Saturday  2   sucroferric oxyhydroxide (VELPHORO) 500 MG chewable tablet Chew 1,000 mg by mouth 3 (three) times daily with meals.      traMADol (ULTRAM) 50 MG tablet Take 1 tablet (50 mg total) by mouth every 6 (six) hours as needed. 20 tablet 0   doxycycline (VIBRAMYCIN) 50 MG capsule Take 1 capsule (50 mg total) by mouth 2 (two) times daily. (Patient not taking: Reported on 11/23/2022) 14 capsule 0   No current facility-administered medications for this visit.    Review of Systems  Constitutional: Positive for fatigue.  HENT: HENT negative.  Eyes: Eyes negative.  GI: Gastrointestinal negative.  Musculoskeletal:       Nonfunctioning right arm fistula Skin: Skin negative.  Neurological: Neurological negative. Hematologic: Positive for bruises/bleeds easily.  Psychiatric: Psychiatric negative.        Objective:  Objective   Vitals:   11/23/22 0923  BP: 119/75  Pulse: 89  Resp: 20  Temp: 97.9 F (36.6 C)  SpO2: 95%  Weight: 180 lb (81.6 kg)  Height: '5\' 4"'$  (1.626 m)   Body mass index is  30.9 kg/m.  Physical Exam HENT:     Head: Normocephalic.     Mouth/Throat:     Mouth: Mucous membranes are moist.  Eyes:     Pupils: Pupils are equal, round, and reactive to light.  Cardiovascular:     Pulses:          Radial pulses are 2+ on the right side.  Abdominal:     General: Abdomen is flat.     Palpations: Abdomen is soft.  Musculoskeletal:     Comments: Pulsatility right arm AV fistula  Skin:    Capillary Refill: Capillary refill takes less than 2 seconds.  Neurological:     Mental Status: She is alert.  Psychiatric:        Mood and Affect: Mood normal.        Thought Content: Thought content normal.     Data: No studies     Assessment/Plan:     78 year old female with end-stage renal disease here for evaluation of her right arm AV fistula which is nonfunctional.  Plan will be for fistulogram on a nondialysis day in the near future.  She does dialyze Tuesdays, Thursdays and Saturdays.  Will need to hold Eliquis 2 days prior to the procedure.  We discussed the risk benefits alternatives she demonstrates good understanding having been through many of these on the left arm in the past.  All questions were answered in the presence of her daughter.     Waynetta Sandy MD [facility]

## 2022-11-28 ENCOUNTER — Other Ambulatory Visit: Payer: Self-pay

## 2022-11-28 ENCOUNTER — Encounter (HOSPITAL_COMMUNITY)
Admission: RE | Disposition: A | Discharge status: Home / Self Care | Payer: Self-pay | Source: Home / Self Care | Attending: Vascular Surgery

## 2022-11-28 ENCOUNTER — Ambulatory Visit (HOSPITAL_COMMUNITY)
Admission: RE | Admit: 2022-11-28 | Discharge: 2022-11-28 | Disposition: A | Discharge status: Home / Self Care | Payer: 59 | Attending: Vascular Surgery | Admitting: Vascular Surgery

## 2022-11-28 DIAGNOSIS — Z87891 Personal history of nicotine dependence: Secondary | ICD-10-CM | POA: Insufficient documentation

## 2022-11-28 DIAGNOSIS — Z7901 Long term (current) use of anticoagulants: Secondary | ICD-10-CM | POA: Insufficient documentation

## 2022-11-28 DIAGNOSIS — I132 Hypertensive heart and chronic kidney disease with heart failure and with stage 5 chronic kidney disease, or end stage renal disease: Secondary | ICD-10-CM | POA: Insufficient documentation

## 2022-11-28 DIAGNOSIS — E1122 Type 2 diabetes mellitus with diabetic chronic kidney disease: Secondary | ICD-10-CM | POA: Diagnosis not present

## 2022-11-28 DIAGNOSIS — T82858A Stenosis of vascular prosthetic devices, implants and grafts, initial encounter: Secondary | ICD-10-CM

## 2022-11-28 DIAGNOSIS — N185 Chronic kidney disease, stage 5: Secondary | ICD-10-CM | POA: Diagnosis not present

## 2022-11-28 DIAGNOSIS — Z992 Dependence on renal dialysis: Secondary | ICD-10-CM | POA: Insufficient documentation

## 2022-11-28 DIAGNOSIS — T82898A Other specified complication of vascular prosthetic devices, implants and grafts, initial encounter: Secondary | ICD-10-CM

## 2022-11-28 DIAGNOSIS — N186 End stage renal disease: Secondary | ICD-10-CM | POA: Insufficient documentation

## 2022-11-28 DIAGNOSIS — I509 Heart failure, unspecified: Secondary | ICD-10-CM | POA: Insufficient documentation

## 2022-11-28 DIAGNOSIS — Y841 Kidney dialysis as the cause of abnormal reaction of the patient, or of later complication, without mention of misadventure at the time of the procedure: Secondary | ICD-10-CM | POA: Diagnosis not present

## 2022-11-28 HISTORY — PX: PERIPHERAL VASCULAR BALLOON ANGIOPLASTY: CATH118281

## 2022-11-28 HISTORY — PX: A/V FISTULAGRAM: CATH118298

## 2022-11-28 LAB — POCT I-STAT, CHEM 8
BUN: 55 mg/dL — ABNORMAL HIGH (ref 8–23)
Calcium, Ion: 1.11 mmol/L — ABNORMAL LOW (ref 1.15–1.40)
Chloride: 100 mmol/L (ref 98–111)
Creatinine, Ser: 12 mg/dL — ABNORMAL HIGH (ref 0.44–1.00)
Glucose, Bld: 87 mg/dL (ref 70–99)
HCT: 46 % (ref 36.0–46.0)
Hemoglobin: 15.6 g/dL — ABNORMAL HIGH (ref 12.0–15.0)
Potassium: 5 mmol/L (ref 3.5–5.1)
Sodium: 139 mmol/L (ref 135–145)
TCO2: 29 mmol/L (ref 22–32)

## 2022-11-28 SURGERY — A/V FISTULAGRAM
Anesthesia: LOCAL | Laterality: Right

## 2022-11-28 MED ORDER — HEPARIN (PORCINE) IN NACL 1000-0.9 UT/500ML-% IV SOLN
INTRAVENOUS | Status: DC | PRN
  Administered 2022-11-28: 500 mL

## 2022-11-28 MED ORDER — SODIUM CHLORIDE 0.9% FLUSH: 3.0000 mL | INTRAVENOUS | Status: DC | PRN

## 2022-11-28 MED ORDER — LIDOCAINE HCL (PF) 1 % IJ SOLN
INTRAMUSCULAR | Status: DC | PRN
  Administered 2022-11-28: 10 mL

## 2022-11-28 SURGICAL SUPPLY — 20 items
BAG SNAP BAND KOVER 36X36 (MISCELLANEOUS) ×2 IMPLANT
BALLN LUTONIX AV 9X60X75 (BALLOONS) ×2
BALLN MUSTANG 7X60X75 (BALLOONS) ×2
BALLN MUSTANG 8X60X75 (BALLOONS) ×2
BALLOON LUTONIX AV 9X60X75 (BALLOONS) IMPLANT
BALLOON MUSTANG 7X60X75 (BALLOONS) IMPLANT
BALLOON MUSTANG 8X60X75 (BALLOONS) IMPLANT
CATH ANGIO 5F BER2 65CM (CATHETERS) IMPLANT
COVER DOME SNAP 22 D (MISCELLANEOUS) ×2 IMPLANT
GUIDEWIRE ANGLED .035X150CM (WIRE) IMPLANT
KIT ENCORE 26 ADVANTAGE (KITS) IMPLANT
KIT MICROPUNCTURE NIT STIFF (SHEATH) IMPLANT
PROTECTION STATION PRESSURIZED (MISCELLANEOUS) ×2
SHEATH PINNACLE R/O II 7F 4CM (SHEATH) IMPLANT
SHEATH PROBE COVER 6X72 (BAG) ×2 IMPLANT
STATION PROTECTION PRESSURIZED (MISCELLANEOUS) ×2 IMPLANT
STOPCOCK MORSE 400PSI 3WAY (MISCELLANEOUS) ×2 IMPLANT
TRAY PV CATH (CUSTOM PROCEDURE TRAY) ×2 IMPLANT
TUBING CIL FLEX 10 FLL-RA (TUBING) ×2 IMPLANT
WIRE STARTER BENTSON 035X150 (WIRE) IMPLANT

## 2022-11-28 NOTE — Progress Notes (Signed)
Discharge instructions given to pt and daughter verbally and in writing. Both verbalize understanding and deny further questions.  Discharged home with daughter who will drive pt

## 2022-11-28 NOTE — Interval H&P Note (Signed)
History and Physical Interval Note:  11/28/2022 1:41 PM  Stacy Hammond  has presented today for surgery, with the diagnosis of end stage renal disease.  The various methods of treatment have been discussed with the patient and family. After consideration of risks, benefits and other options for treatment, the patient has consented to  Procedure(s): A/V Fistulagram (Right) as a surgical intervention.  The patient's history has been reviewed, patient examined, no change in status, stable for surgery.  I have reviewed the patient's chart and labs.  Questions were answered to the patient's satisfaction.     Servando Snare

## 2022-11-28 NOTE — Op Note (Signed)
    Patient name: Stacy Hammond MRN: [id number] DOB: 1945/11/22 Sex: female  11/28/2022 Pre-operative Diagnosis: End-stage renal disease, malfunction right arm AV fistula Post-operative diagnosis:  Same Surgeon:  Eda Paschal. Donzetta Matters, MD Procedure Performed: 1.  Ultrasound-guided cannulation right arm AV fistula 2.  Right upper extremity fistulogram 3.  Drug-coated balloon angioplasty of right subclavian and innominate vein with 9 mm lutonix 4.  Plain balloon angioplasty right arm AV fistula 8 mm balloon  Indications: 77 year old female with end-stage renal disease on dialysis via right arm to stage basilic vein fistula.  She has previous left arm access which is failed.  Currently dialyzing via left IJ catheter.  She also has a pacemaker on the right.  Prior to her revision of her basilic vein fistula she underwent drug-coated balloon angioplasty of central venous stenosis.  She now has right arm fistula that is unable to be cannulated and she is indicated for fistulogram with intervention.  Findings: There was tight stenosis approximately 80% in the mid upper arm vein.  This was related to 0% residual stenosis.  Since that she was occluded and this was ballooned to balloon with of at least 60% with drug-coated balloon.  At completion there was a very strong thrill.  Fistula is okay for use.   Procedure:  The patient was identified in the holding area and taken to room 8.  The patient was then placed supine on the table and prepped and draped in the usual sterile fashion.  A time out was called.  Ultrasound was used to evaluate the right arm AV fistula.  The area was anesthetized with 1% lidocaine cannulated micropuncture followed by wire sheath.  An image was saved to the permanent record.  We performed right upper extremity fistulogram.  With the above findings we placed a Bentson wire followed by a 7 [place] sheath.  We then crossed centrally and confirmed intraluminal access.  We then primarily  ballooned central with 7 and 8 mm balloons as well as peripheral 7 and 8 mm plain balloons and peripherally we did resolve the stenosis to 0%.  Centrally and used a 9 mm drug-coated balloon and this was improved to approximately 40% stenosis but there was brisk flow and minimal residual collateral filling.  Satisfied with this remove the wire.  We suture-ligated the cannulation site and remove the sheath.  Patient tolerated procedure without any complication.  Contrast: 70cc   Jagjit Riner C. Donzetta Matters, MD [facility] [place] Office: [phone] Pager: [phone]

## 2022-11-29 ENCOUNTER — Encounter (HOSPITAL_COMMUNITY): Payer: Self-pay | Admitting: Vascular Surgery

## 2022-12-01 ENCOUNTER — Encounter (HOSPITAL_COMMUNITY): Payer: Self-pay | Admitting: Vascular Surgery

## 2023-08-28 ENCOUNTER — Ambulatory Visit (HOSPITAL_COMMUNITY)
Admission: RE | Admit: 2023-08-28 | Discharge: 2023-08-28 | Disposition: A | Discharge status: Home / Self Care | Payer: 59 | Attending: Nephrology | Admitting: Nephrology

## 2023-08-28 ENCOUNTER — Encounter (HOSPITAL_COMMUNITY): Payer: Self-pay | Admitting: Vascular Surgery

## 2023-08-28 ENCOUNTER — Encounter (HOSPITAL_COMMUNITY)
Admission: RE | Disposition: A | Discharge status: Home / Self Care | Payer: Self-pay | Source: Home / Self Care | Attending: Nephrology

## 2023-08-28 ENCOUNTER — Other Ambulatory Visit: Payer: Self-pay

## 2023-08-28 DIAGNOSIS — T82858A Stenosis of vascular prosthetic devices, implants and grafts, initial encounter: Secondary | ICD-10-CM | POA: Diagnosis present

## 2023-08-28 DIAGNOSIS — I132 Hypertensive heart and chronic kidney disease with heart failure and with stage 5 chronic kidney disease, or end stage renal disease: Secondary | ICD-10-CM | POA: Diagnosis not present

## 2023-08-28 DIAGNOSIS — Z992 Dependence on renal dialysis: Secondary | ICD-10-CM | POA: Insufficient documentation

## 2023-08-28 DIAGNOSIS — Z853 Personal history of malignant neoplasm of breast: Secondary | ICD-10-CM | POA: Diagnosis not present

## 2023-08-28 DIAGNOSIS — Z950 Presence of cardiac pacemaker: Secondary | ICD-10-CM | POA: Diagnosis not present

## 2023-08-28 DIAGNOSIS — N186 End stage renal disease: Secondary | ICD-10-CM | POA: Insufficient documentation

## 2023-08-28 DIAGNOSIS — Y841 Kidney dialysis as the cause of abnormal reaction of the patient, or of later complication, without mention of misadventure at the time of the procedure: Secondary | ICD-10-CM | POA: Diagnosis not present

## 2023-08-28 DIAGNOSIS — I509 Heart failure, unspecified: Secondary | ICD-10-CM | POA: Diagnosis not present

## 2023-08-28 DIAGNOSIS — E1122 Type 2 diabetes mellitus with diabetic chronic kidney disease: Secondary | ICD-10-CM | POA: Diagnosis not present

## 2023-08-28 DIAGNOSIS — G4733 Obstructive sleep apnea (adult) (pediatric): Secondary | ICD-10-CM | POA: Diagnosis not present

## 2023-08-28 DIAGNOSIS — Z87891 Personal history of nicotine dependence: Secondary | ICD-10-CM | POA: Diagnosis not present

## 2023-08-28 DIAGNOSIS — Z79899 Other long term (current) drug therapy: Secondary | ICD-10-CM | POA: Insufficient documentation

## 2023-08-28 DIAGNOSIS — N250 Renal osteodystrophy: Secondary | ICD-10-CM | POA: Diagnosis not present

## 2023-08-28 HISTORY — PX: A/V FISTULAGRAM: CATH118298

## 2023-08-28 LAB — GLUCOSE, CAPILLARY: Glucose-Capillary: 69 mg/dL — ABNORMAL LOW (ref 70–99)

## 2023-08-28 SURGERY — A/V FISTULAGRAM
Anesthesia: LOCAL | Laterality: Left

## 2023-08-28 MED ORDER — SODIUM CHLORIDE 0.9% FLUSH: 10.0000 mL | Freq: Two times a day (BID) | INTRAVENOUS | Status: DC

## 2023-08-28 MED ORDER — LIDOCAINE HCL (PF) 1 % IJ SOLN
INTRAMUSCULAR | Status: AC
  Filled 2023-08-28: qty 30

## 2023-08-28 MED ORDER — IODIXANOL 320 MG/ML IV SOLN
INTRAVENOUS | Status: DC | PRN
  Administered 2023-08-28: 25 mL

## 2023-08-28 MED ORDER — ACETAMINOPHEN 325 MG PO TABS: 650.0000 mg | ORAL_TABLET | ORAL | Status: DC | PRN

## 2023-08-28 MED ORDER — MIDAZOLAM HCL 2 MG/2ML IJ SOLN
INTRAMUSCULAR | Status: DC | PRN
  Administered 2023-08-28: 1 mg via INTRAVENOUS

## 2023-08-28 MED ORDER — LIDOCAINE HCL (PF) 1 % IJ SOLN
INTRAMUSCULAR | Status: DC | PRN
  Administered 2023-08-28: 2 mL

## 2023-08-28 MED ORDER — FENTANYL CITRATE (PF) 100 MCG/2ML IJ SOLN
INTRAMUSCULAR | Status: DC | PRN
  Administered 2023-08-28: 25 ug via INTRAVENOUS

## 2023-08-28 MED ORDER — ONDANSETRON HCL 4 MG/2ML IJ SOLN: 4.0000 mg | Freq: Four times a day (QID) | INTRAMUSCULAR | Status: DC | PRN

## 2023-08-28 MED ORDER — FENTANYL CITRATE (PF) 100 MCG/2ML IJ SOLN
INTRAMUSCULAR | Status: AC
  Filled 2023-08-28: qty 2

## 2023-08-28 MED ORDER — MIDAZOLAM HCL 2 MG/2ML IJ SOLN
INTRAMUSCULAR | Status: AC
  Filled 2023-08-28: qty 2

## 2023-08-28 MED ORDER — HEPARIN (PORCINE) IN NACL 1000-0.9 UT/500ML-% IV SOLN
INTRAVENOUS | Status: DC | PRN
  Administered 2023-08-28: 500 mL

## 2023-08-28 MED ORDER — SODIUM CHLORIDE 0.9% FLUSH: 3.0000 mL | INTRAVENOUS | Status: DC | PRN

## 2023-08-28 SURGICAL SUPPLY — 11 items
BALLN MUSTANG 7.0X40 75 (BALLOONS) ×1
BALLOON MUSTANG 7.0X40 75 (BALLOONS) IMPLANT
CATH SLIP KMP 65CM 5FR (CATHETERS) IMPLANT
COVER DOME SNAP 22 D (MISCELLANEOUS) ×1 IMPLANT
KIT ENCORE 26 ADVANTAGE (KITS) IMPLANT
SHEATH PINNACLE R/O II 6F 4CM (SHEATH) IMPLANT
STOPCOCK MORSE 400PSI 3WAY (MISCELLANEOUS) ×1 IMPLANT
SYR MEDALLION 10ML (SYRINGE) IMPLANT
TRAY PV CATH (CUSTOM PROCEDURE TRAY) ×1 IMPLANT
TUBING CIL FLEX 10 FLL-RA (TUBING) ×1 IMPLANT
WIRE STARTER BENTSON 035X150 (WIRE) IMPLANT

## 2023-08-28 NOTE — H&P (Signed)
Chief Complaint: Decreased flows  Assessment/Plan: ESRD: continue outpatient regimen; no absolute indication for dialysis and pt appears to be comfortable. Decreased access flows: fistulogram and on exam there is an inflow lesion. HTN: restart home regimen. Renal osteodystrophy: continue binders. CHF appears to be compensated.   HPI: Stacy Hammond is an 77 y.o. female  lt breast cancer s/p lumpectomy, CHF, DM, HTN, pacemaker, OSA, ESRD with h/o central PTA with a 12mm on 03/03/2023, inflow 7mm PTA on 03/31/23 and last outflow central 8mm PTA on 10/9 referred for decreasing flows in the right BCF but denies any arm swelling. Patient denies fever/ chills/ nausea/ SOB/ CP.  ROS Pertinent items are noted in HPI.  Chemistry and CBC: Creatinine, Ser  Date/Time Value Ref Range Status  11/28/2022 01:47 PM 12.00 (H) 0.44 - 1.00 mg/dL Final  40/98/1191 47:82 PM 10.60 (H) 0.44 - 1.00 mg/dL Final  95/62/1308 65:78 PM 9.56 (H) 0.44 - 1.00 mg/dL Final  46/96/2952 84:13 PM 10.20 (H) 0.44 - 1.00 mg/dL Final  24/40/1027 25:36 AM 11.20 (H) 0.44 - 1.00 mg/dL Final  64/40/3474 25:95 AM 11.00 (H) 0.44 - 1.00 mg/dL Final  63/87/5643 32:95 AM 8.30 (H) 0.44 - 1.00 mg/dL Final  18/84/1660 63:01 AM 8.10 (H) 0.44 - 1.00 mg/dL Final   No results for input(s): "NA", "K", "CL", "CO2", "GLUCOSE", "BUN", "CREATININE", "CALCIUM", "PHOS" in the last 168 hours.  Invalid input(s): "ALB" No results for input(s): "WBC", "NEUTROABS", "HGB", "HCT", "MCV", "PLT" in the last 168 hours. Liver Function Tests: No results for input(s): "AST", "ALT", "ALKPHOS", "BILITOT", "PROT", "ALBUMIN" in the last 168 hours. No results for input(s): "LIPASE", "AMYLASE" in the last 168 hours. No results for input(s): "AMMONIA" in the last 168 hours. Cardiac Enzymes: No results for input(s): "CKTOTAL", "CKMB", "CKMBINDEX", "TROPONINI" in the last 168 hours. Iron Studies: No results for input(s): "IRON", "TIBC", "TRANSFERRIN", "FERRITIN" in  the last 72 hours. PT/INR: @LABRCNTIP (inr:5)  Xrays/Other Studies: ) Results for orders placed or performed during the hospital encounter of 08/28/23 (from the past 48 hour(s))  Glucose, capillary     Status: Abnormal   Collection Time: 08/28/23  8:03 AM  Result Value Ref Range   Glucose-Capillary 69 (L) 70 - 99 mg/dL    Comment: Glucose reference range applies only to samples taken after fasting for at least 8 hours.   PERIPHERAL VASCULAR CATHETERIZATION  Result Date: 08/28/2023 Images from the original result were not included. Patient name: Stacy Hammond MRN: [id number] DOB: 1946/06/01 Sex: female 08/28/2023 Pre-operative Diagnosis: End-stage renal disease, malfunction right arm AV fistula Post-operative diagnosis:  Same Surgeon:  Luanna Salk. Randie Heinz, MD Assistant: Paulene Floor, MD Procedure Performed: 1.  Right upper extremity fistulogram 2.  Balloon angioplasty right arm AV fistula with 7 x 40 mm Mustang 3.  Moderate sedation with fentanyl and Versed for 12 minutes Indications: 77 year old female with history of end-stage renal disease currently on dialysis via right arm AV fistula.  She has had difficulty with flow on dialysis and is now indicated for fistulogram with possible invervention. Findings: There was a tight inflow stenosis measuring approximately 90% resolved to 0% residual stenosis.  There is a pacemaker in place that causes some collateralization around the pacemaker but no frank evidence of stenosis or occlusion of the subclavian or innominate vein on the right.  Procedure:  The patient was identified in the holding area and taken to room 8.  The patient was then placed supine on the table and prepped and draped in the  usual sterile fashion.  A time out was called.  The right arm fistula was accessed with a micropuncture needle followed by wire and sheath and a Bentson was placed followed by a 6 French sheath in the right upper extremity fistulogram was performed.  Concomitantly fentanyl  and Versed was administered through the sheath and her vital signs were monitored throughout the case.  After fistulogram we performed retrograde angiography which demonstrated the tight stenosis.  This was crossed with a Bentson wire and initially balloon angioplastied with 7 x 40 Mustang.  There was 0% residual stenosis and the wire was removed and a suture bolster was placed.  She tolerated the procedure without any complication. Contrast: 25cc Brandon C. Randie Heinz, MD [facility] [place] Office: [phone] Pager: [phone]    PMH:   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   [employer]   Sleep apnea    Does not use CPAP   Stroke (HCC)    light   Thyroid disease     PSH:   Past Surgical History:  Procedure Laterality Date   A/V FISTULAGRAM Right 11/28/2022   Procedure: A/V Fistulagram;  Surgeon: Maeola Harman, MD;  Location: [facility] INVASIVE CV LAB;  Service: Cardiovascular;  Laterality: Right;   A/V FISTULAGRAM Left 08/28/2023   Procedure: A/V Fistulagram;  Surgeon: Maeola Harman, MD;  Location: [facility] INVASIVE CV LAB;  Service: Vascular;  Laterality: Left;   ABDOMINAL HYSTERECTOMY     partial   ANKLE FUSION Right 2014?   done at [facility]   AV FISTULA PLACEMENT     AV FISTULA PLACEMENT Left 03/27/2020   Procedure: INSERTION OF LEFT UPPER ARM ARTERIOVENOUS (AV) GORE-TEX GRAFT ARM USING 4X7MM X 45 CM STRETCH GORETEX  GRAFT;  Surgeon: Larina Earthly, MD;  [facility] OR;  Service: Vascular;  Laterality: Left;   AV FISTULA PLACEMENT Right 04/01/2022   Procedure: RIGHT ARM ARTERIOVENOUS (AV) FISTULA  CREATION;  Surgeon: Maeola Harman, MD;  Location: [facility] OR;  Service: Vascular;  Laterality: Right;   BASCILIC VEIN TRANSPOSITION Right 09/02/2022   Procedure: RIGHT ARM SECOND STAGE BASILIC VEIN ARTERIOVENOUS FISTULA;  Surgeon: Maeola Harman, MD;  Location: [facility] OR;  Service: Vascular;  Laterality: Right;   BREAST LUMPECTOMY Left    COLONOSCOPY W/ POLYPECTOMY     EYE SURGERY Bilateral    Cataract   FISTULA SUPERFICIALIZATION Left 06/01/2018   Procedure: FISTULA PLICATION LEFT ARM;  Surgeon: Nada Libman, MD;  [facility] OR;  Service: Vascular;  Laterality: Left;   INSERTION OF DIALYSIS CATHETER Left 03/27/2020   Procedure: INSERTION OF TUNNELED DIALYSIS CATHETER USING PALINDROME 23CM;  Surgeon: Larina Earthly, MD;  Location: [facility] OR;  Service: Vascular;  Laterality: Left;   PACEMAKER PLACEMENT  03/21/2015   [employer]   PERIPHERAL  VASCULAR BALLOON ANGIOPLASTY Right 06/27/2022   Procedure: PERIPHERAL VASCULAR BALLOON ANGIOPLASTY;  Surgeon: Maeola Harman, MD;  Location: [facility] INVASIVE CV LAB;  Service: Cardiovascular;  Laterality: Right;   PERIPHERAL VASCULAR BALLOON ANGIOPLASTY  11/28/2022   Procedure: PERIPHERAL VASCULAR BALLOON ANGIOPLASTY;  Surgeon: Maeola Harman, MD;  Location: [facility] INVASIVE CV LAB;  Service: Cardiovascular;;  right AVF   REVISON OF ARTERIOVENOUS FISTULA Left 09/14/2016   Procedure: LEFT CEPHALIC TURNDOWN;  Surgeon: Fransisco Hertz, MD;  Location: [facility] OR;  Service: Vascular;  Laterality: Left;    Allergies:  Allergies  Allergen Reactions   Penicillins Hives    Has patient had a PCN reaction causing immediate rash, facial/tongue/throat swelling, SOB or lightheadedness with hypotension: No Has patient had a PCN reaction causing severe rash involving  mucus membranes or skin necrosis: No Has patient had a PCN reaction that required hospitalization: No Has patient had a PCN reaction occurring within the last 10 years: Yes If all of the above answers are "NO", then may proceed with Cephalosporin use.    Oxycodone Nausea And Vomiting    Medications:   Prior to Admission medications   Medication Sig Start Date End Date Taking? Authorizing Provider  acetaminophen (TYLENOL) 500 MG tablet Take 1,000 mg by mouth every 6 (six) hours as needed for headache or moderate pain.   Yes [provider]  amiodarone (PACERONE) 100 MG tablet Take 100 mg by mouth daily.   Yes [provider]  apixaban (ELIQUIS) 2.5 MG TABS tablet Take 2.5 mg by mouth 2 (two) times daily. 02/17/22  Yes [provider]  atorvastatin (LIPITOR) 40 MG tablet Take 40 mg by mouth daily.   Yes [provider]  b complex-vitamin c-folic acid (NEPHRO-VITE) 0.8 MG TABS tablet Take 1 tablet by mouth at bedtime.   Yes [provider]  Ketotifen Fumarate (ALLERGY EYE DROPS OP) Place 1 drop into both eyes daily as needed (allergies).   Yes [provider]  midodrine (PROAMATINE) 5 MG tablet Take 10 mg by mouth See admin instructions. Take 10 mg by twice daily on Tuesday, Thursday and Saturday 05/01/18  Yes [provider]  SENSIPAR 30 MG tablet Take 30 mg by mouth 3 (three) times a week. 08/02/22  Yes [provider]  sucroferric oxyhydroxide (VELPHORO) 500 MG chewable tablet Chew 1,000 mg by mouth 3 (three) times daily with meals.    Yes [provider]  doxycycline (VIBRAMYCIN) 50 MG capsule Take 1 capsule (50 mg total) by mouth 2 (two) times daily. Patient not taking: Reported on 11/23/2022 09/12/22   Loel Dubonnet P, PA-C  Menthol, Topical Analgesic, (BIOFREEZE EX) Apply 1 Application topically daily as needed (pain). Patient not taking: Reported on 08/24/2023    [provider]  traMADol (ULTRAM) 50  MG tablet Take 1 tablet (50 mg total) by mouth every 6 (six) hours as needed. Patient not taking: Reported on 08/24/2023 09/02/22   Dara Lords, PA-C    Discontinued Meds:   Medications Discontinued During This Encounter  Medication Reason   iodixanol (VISIPAQUE) 320 MG/ML injection Patient Discharge   midazolam (VERSED) injection Patient Discharge   fentaNYL (SUBLIMAZE) injection Patient Discharge   Heparin (Porcine) in NaCl 1000-0.9 UT/500ML-% SOLN Patient Discharge   lidocaine (PF) (XYLOCAINE) 1 % injection Patient Discharge   sodium chloride flush (NS) 0.9 % injection 3 mL Stop Taking at Discharge   0.9 %  sodium chloride infusion Stop Taking at Discharge    Social History:  reports that she quit smoking about 36 years ago. Her smoking use included cigarettes. She started smoking about 57 years ago. She has never used smokeless tobacco. She reports that she does not drink alcohol and does not use drugs.  Family History:  No family history on file.  Blood pressure 131/71, pulse 87, temperature (!) 97.5 F (36.4 C), temperature source Oral, resp. rate (!) 22, SpO2 91%. General appearance: alert, cooperative, and appears stated age Head: Normocephalic, without obvious abnormality, atraumatic Eyes: negative Neck: no adenopathy, no carotid bruit, no JVD, supple, symmetrical, trachea midline, and thyroid not enlarged, symmetric, no tenderness/mass/nodules Resp: clear to auscultation bilaterally Cardio: regular rate and rhythm GI: soft, non-tender; bowel sounds normal; no masses,  no organomegaly Extremities: extremities normal, atraumatic, no cyanosis or edema Access: Rt BCF       Ethelene Hal, MD 08/28/2023, 2:39 PM

## 2023-08-28 NOTE — Op Note (Signed)
The patient has presented today for angiogram with a diagnosis of low decreasing flows in her fistula in the right upper arm. Various methods of treatment have been discussed with the patient and family.  After consideration of after consideration of risk, benefits and other options for treatment, the patient has consented to an angiogram with potential angioplasty and stenting if needed.  The patient's history has been reviewed and the patient has been examined, no changes in status.  Stable for angiogram/angioplasty  I have reviewed the patient's chart and labs.  Questions were answered to the patient's satisfaction.

## 2023-08-28 NOTE — Op Note (Addendum)
    Patient name: Stacy Hammond MRN: [id number] DOB: 22-Mar-1946 Sex: female  08/28/2023 Pre-operative Diagnosis: End-stage renal disease, malfunction right arm AV fistula Post-operative diagnosis:  Same Surgeon:  Luanna Salk. Randie Heinz, MD Assistant: Paulene Floor, MD Procedure Performed: 1.  Right upper extremity fistulogram 2.  Balloon angioplasty right arm AV fistula with 7 x 40 mm Mustang 3.  Moderate sedation with fentanyl and Versed for 12 minutes  Indications: 77 year old female with history of end-stage renal disease currently on dialysis via right arm AV fistula.  She has had difficulty with flow on dialysis and is now indicated for fistulogram with possible invervention.  Findings: There was a tight inflow stenosis measuring approximately 90% resolved to 0% residual stenosis.  There is a pacemaker in place that causes some collateralization around the pacemaker but no frank evidence of stenosis or occlusion of the subclavian or innominate vein on the right.   Procedure:  The patient was identified in the holding area and taken to room 8.  The patient was then placed supine on the table and prepped and draped in the usual sterile fashion.  A time out was called.  The right arm fistula was accessed with a micropuncture needle followed by wire and sheath and a Bentson was placed followed by a 6 French sheath in the right upper extremity fistulogram was performed.  Concomitantly fentanyl and Versed was administered through the sheath and her vital signs were monitored throughout the case.  After fistulogram we performed retrograde angiography which demonstrated the tight stenosis.  This was crossed with a Bentson wire and initially balloon angioplastied with 7 x 40 Mustang.  There was 0% residual stenosis and the wire was removed and a suture bolster was placed.  She tolerated the procedure without any complication.   Contrast: 25cc   Wanetta Funderburke C. Randie Heinz, MD [facility] Office: [phone] Pager: [phone]

## 2023-10-30 ENCOUNTER — Encounter (HOSPITAL_COMMUNITY): Payer: Self-pay | Admitting: Nephrology

## 2023-10-30 ENCOUNTER — Ambulatory Visit (HOSPITAL_COMMUNITY)
Admission: RE | Admit: 2023-10-30 | Discharge: 2023-10-30 | Disposition: A | Discharge status: Home / Self Care | Payer: 59 | Attending: Nephrology | Admitting: Nephrology

## 2023-10-30 ENCOUNTER — Other Ambulatory Visit: Payer: Self-pay

## 2023-10-30 ENCOUNTER — Encounter (HOSPITAL_COMMUNITY)
Admission: RE | Disposition: A | Discharge status: Home / Self Care | Payer: Self-pay | Source: Home / Self Care | Attending: Nephrology

## 2023-10-30 DIAGNOSIS — Y832 Surgical operation with anastomosis, bypass or graft as the cause of abnormal reaction of the patient, or of later complication, without mention of misadventure at the time of the procedure: Secondary | ICD-10-CM | POA: Diagnosis not present

## 2023-10-30 DIAGNOSIS — T82858A Stenosis of vascular prosthetic devices, implants and grafts, initial encounter: Secondary | ICD-10-CM | POA: Diagnosis present

## 2023-10-30 DIAGNOSIS — I132 Hypertensive heart and chronic kidney disease with heart failure and with stage 5 chronic kidney disease, or end stage renal disease: Secondary | ICD-10-CM | POA: Diagnosis not present

## 2023-10-30 DIAGNOSIS — E1122 Type 2 diabetes mellitus with diabetic chronic kidney disease: Secondary | ICD-10-CM | POA: Diagnosis not present

## 2023-10-30 DIAGNOSIS — N186 End stage renal disease: Secondary | ICD-10-CM | POA: Insufficient documentation

## 2023-10-30 DIAGNOSIS — N250 Renal osteodystrophy: Secondary | ICD-10-CM | POA: Insufficient documentation

## 2023-10-30 DIAGNOSIS — Z87891 Personal history of nicotine dependence: Secondary | ICD-10-CM | POA: Diagnosis not present

## 2023-10-30 DIAGNOSIS — I509 Heart failure, unspecified: Secondary | ICD-10-CM | POA: Insufficient documentation

## 2023-10-30 HISTORY — PX: A/V FISTULAGRAM: CATH118298

## 2023-10-30 HISTORY — PX: PERIPHERAL VASCULAR BALLOON ANGIOPLASTY: CATH118281

## 2023-10-30 SURGERY — A/V FISTULAGRAM
Anesthesia: LOCAL

## 2023-10-30 MED ORDER — FENTANYL CITRATE (PF) 100 MCG/2ML IJ SOLN
INTRAMUSCULAR | Status: AC
  Filled 2023-10-30: qty 2

## 2023-10-30 MED ORDER — IODIXANOL 320 MG/ML IV SOLN
INTRAVENOUS | Status: DC | PRN
  Administered 2023-10-30: 12 mL via INTRAVENOUS

## 2023-10-30 MED ORDER — HEPARIN (PORCINE) IN NACL 1000-0.9 UT/500ML-% IV SOLN
INTRAVENOUS | Status: DC | PRN
  Administered 2023-10-30: 500 mL

## 2023-10-30 MED ORDER — MIDAZOLAM HCL 2 MG/2ML IJ SOLN
INTRAMUSCULAR | Status: AC
Start: 2023-10-30 — End: ?
  Filled 2023-10-30: qty 2

## 2023-10-30 MED ORDER — LIDOCAINE HCL (PF) 1 % IJ SOLN
INTRAMUSCULAR | Status: DC | PRN
  Administered 2023-10-30: 2 mL via SUBCUTANEOUS

## 2023-10-30 MED ORDER — MIDAZOLAM HCL 2 MG/2ML IJ SOLN
INTRAMUSCULAR | Status: DC | PRN
  Administered 2023-10-30: 1 mg via INTRAVENOUS

## 2023-10-30 MED ORDER — FENTANYL CITRATE (PF) 100 MCG/2ML IJ SOLN
INTRAMUSCULAR | Status: DC | PRN
  Administered 2023-10-30: 25 ug via INTRAVENOUS

## 2023-10-30 MED ORDER — LIDOCAINE HCL (PF) 1 % IJ SOLN
INTRAMUSCULAR | Status: AC
  Filled 2023-10-30: qty 30

## 2023-10-30 SURGICAL SUPPLY — 12 items
BAG SNAP BAND KOVER 36X36 (MISCELLANEOUS) ×2 IMPLANT
BALLN MUSTANG 10.0X40 75 (BALLOONS) ×2
BALLN MUSTANG 12.0X40 75 (BALLOONS) ×2
BALLOON MUSTANG 10.0X40 75 (BALLOONS) IMPLANT
BALLOON MUSTANG 12.0X40 75 (BALLOONS) IMPLANT
CATH ANGIO 5F BER2 65CM (CATHETERS) IMPLANT
COVER DOME SNAP 22 D (MISCELLANEOUS) ×2 IMPLANT
GUIDEWIRE ANGLED .035X150CM (WIRE) IMPLANT
SHEATH PINNACLE R/O II 6F 4CM (SHEATH) IMPLANT
SHEATH PINNACLE R/O II 7F 4CM (SHEATH) IMPLANT
SYR MEDALLION 10ML (SYRINGE) IMPLANT
TRAY PV CATH (CUSTOM PROCEDURE TRAY) ×2 IMPLANT

## 2023-10-30 NOTE — H&P (Addendum)
Chief Complaint: Decreased flows and arm swelling  Interval H&P  The patient has presented today for an angiogram/ angioplasty.  Various methods of treatment have been discussed with the patient.  After consideration of risk, benefits and other options for treatment, the patient has consented to a angiogram/ angioplasty with  possible stent placement.   Risks of angiogram with potential angioplasty and stenting if needed.contrast reaction, extravasation/ bleeding, dissection, hypotension and death were explained to the patient.  The patient's history has been reviewed and the patient has been examined, no changes in status.  Stable for angiogram/angioplasty  I have reviewed the patient's chart and labs.  Questions were answered to the patient's satisfaction.   Assessment/Plan: ESRD: continue outpatient regimen; no absolute indication for dialysis and pt appears to be comfortable. Decreased access flows and right arm swelling: fistulogram and on exam there is an outflow lesion.  Last central 12 mm PTA was on 03/03/2023. HTN: restart home regimen. Renal osteodystrophy: continue binders. CHF appears to be compensated.   HPI: Stacy Hammond is an 78 y.o. female  lt breast cancer s/p lumpectomy, CHF, DM, HTN, pacemaker, OSA, ESRD with h/o central PTA with a 12mm on 03/03/2023, inflow 7mm PTA on 08/28/23 and last outflow central 8mm PTA on 10/9 referred for decreasing flows in the right BCF but denies any arm swelling. Patient denies fever/ chills/ nausea/ SOB/ CP.  ROS Pertinent items are noted in HPI.  Chemistry and CBC: Creatinine, Ser  Date/Time Value Ref Range Status  11/28/2022 01:47 PM 12.00 (H) 0.44 - 1.00 mg/dL Final  41/66/0630 16:01 PM 10.60 (H) 0.44 - 1.00 mg/dL Final  09/32/3557 32:20 PM 9.56 (H) 0.44 - 1.00 mg/dL Final  25/42/7062 37:62 PM 10.20 (H) 0.44 - 1.00 mg/dL Final  83/15/1761 60:73 AM 11.20 (H) 0.44 - 1.00 mg/dL Final  71/03/2693 85:46 AM 11.00 (H) 0.44 - 1.00 mg/dL  Final  27/12/5007 38:18 AM 8.30 (H) 0.44 - 1.00 mg/dL Final  29/93/7169 67:89 AM 8.10 (H) 0.44 - 1.00 mg/dL Final   No results for input(s): "NA", "K", "CL", "CO2", "GLUCOSE", "BUN", "CREATININE", "CALCIUM", "PHOS" in the last 168 hours.  Invalid input(s): "ALB" No results for input(s): "WBC", "NEUTROABS", "HGB", "HCT", "MCV", "PLT" in the last 168 hours. Liver Function Tests: No results for input(s): "AST", "ALT", "ALKPHOS", "BILITOT", "PROT", "ALBUMIN" in the last 168 hours. No results for input(s): "LIPASE", "AMYLASE" in the last 168 hours. No results for input(s): "AMMONIA" in the last 168 hours. Cardiac Enzymes: No results for input(s): "CKTOTAL", "CKMB", "CKMBINDEX", "TROPONINI" in the last 168 hours. Iron Studies: No results for input(s): "IRON", "TIBC", "TRANSFERRIN", "FERRITIN" in the last 72 hours. PT/INR: @LABRCNTIP (inr:5)  Xrays/Other Studies: ) No results found for this or any previous visit (from the past 48 hours).  No results found.  PMH:   Past Medical History:  Diagnosis Date   Anemia    Arthritis    Cancer (HCC)    Left Breast   CHF (congestive heart failure) (HCC)    Chronic kidney disease    Tues, Thurs, Sat   Diabetes mellitus without complication (HCC)    type 2 - no meds diet controlled   DVT (deep venous thrombosis) (HCC)    Dyspnea    with exertion   Dysrhythmia    NSVT, PAF   Hyperlipidemia    Hypertension    Hypotension    associated with hemodialysis   Neuromuscular disorder (HCC)    Peripheral arterial disease (HCC)    Presence of  permanent cardiac pacemaker 03/11/2015   [employer]   Sleep apnea    Does not use CPAP   Stroke (HCC)    light   Thyroid disease     PSH:   Past Surgical History:  Procedure Laterality Date   A/V FISTULAGRAM Right 11/28/2022   Procedure: A/V Fistulagram;  Surgeon: Maeola Harman, MD;  Location: [facility] INVASIVE CV LAB;  Service: Cardiovascular;  Laterality: Right;   A/V FISTULAGRAM Left  08/28/2023   Procedure: A/V Fistulagram;  Surgeon: Maeola Harman, MD;  Location: [facility] INVASIVE CV LAB;  Service: Vascular;  Laterality: Left;   ABDOMINAL HYSTERECTOMY     partial   ANKLE FUSION Right 2014?   done at [facility]   AV FISTULA PLACEMENT     AV FISTULA PLACEMENT Left 03/27/2020   Procedure: INSERTION OF LEFT UPPER ARM ARTERIOVENOUS (AV) GORE-TEX GRAFT ARM USING 4X7MM X 45 CM STRETCH GORETEX GRAFT;  Surgeon: Larina Earthly, MD;  Location: [facility] OR;  Service: Vascular;  Laterality: Left;   AV FISTULA PLACEMENT Right 04/01/2022   Procedure: RIGHT ARM ARTERIOVENOUS (AV) FISTULA  CREATION;  Surgeon: Maeola Harman, MD;  Location: [facility] OR;  Service: Vascular;  Laterality: Right;   BASCILIC VEIN TRANSPOSITION Right 09/02/2022   Procedure: RIGHT ARM SECOND STAGE BASILIC VEIN ARTERIOVENOUS FISTULA;  Surgeon: Maeola Harman, MD;  Location: [facility] OR;  Service: Vascular;  Laterality: Right;   BREAST LUMPECTOMY Left    COLONOSCOPY W/ POLYPECTOMY     EYE SURGERY Bilateral    Cataract   FISTULA SUPERFICIALIZATION Left 06/01/2018   Procedure: FISTULA PLICATION LEFT ARM;  Surgeon: Nada Libman, MD;  Location: [facility] OR;  Service: Vascular;  Laterality: Left;   INSERTION OF DIALYSIS CATHETER Left 03/27/2020   Procedure: INSERTION OF TUNNELED DIALYSIS CATHETER USING PALINDROME 23CM;  Surgeon: Larina Earthly, MD;  Location: [facility] OR;  Service: Vascular;  Laterality: Left;   PACEMAKER PLACEMENT  03/21/2015   [employer]   PERIPHERAL VASCULAR BALLOON ANGIOPLASTY Right 06/27/2022   Procedure: PERIPHERAL VASCULAR BALLOON ANGIOPLASTY;  Surgeon: Maeola Harman, MD;  Location: [facility] INVASIVE CV LAB;  Service: Cardiovascular;  Laterality: Right;   PERIPHERAL VASCULAR BALLOON ANGIOPLASTY  11/28/2022   Procedure: PERIPHERAL VASCULAR BALLOON ANGIOPLASTY;  Surgeon: Maeola Harman, MD;  Location: [facility] INVASIVE CV LAB;  Service: Cardiovascular;;  right AVF   REVISON OF  ARTERIOVENOUS FISTULA Left 09/14/2016   Procedure: LEFT CEPHALIC TURNDOWN;  Surgeon: Fransisco Hertz, MD;  Location: [facility] OR;  Service: Vascular;  Laterality: Left;    Allergies:  Allergies  Allergen Reactions   Penicillins Hives    Has patient had a PCN reaction causing immediate rash, facial/tongue/throat swelling, SOB or lightheadedness with hypotension: No Has patient had a PCN reaction causing severe rash involving mucus membranes or skin necrosis: No Has patient had a PCN reaction that required hospitalization: No Has patient had a PCN reaction occurring within the last 10 years: Yes If all of the above answers are "NO", then may proceed with Cephalosporin use.    Oxycodone Nausea And Vomiting    Medications:   Prior to Admission medications   Medication Sig Start Date End Date Taking? Authorizing Provider  acetaminophen (TYLENOL) 500 MG tablet Take 1,000 mg by mouth every 6 (six) hours as needed for headache or moderate pain.   Yes [provider]  amiodarone (PACERONE) 100 MG tablet Take 100 mg by mouth daily.   Yes [provider]  apixaban Everlene Balls)  2.5 MG TABS tablet Take 2.5 mg by mouth 2 (two) times daily. 02/17/22  Yes [provider]  atorvastatin (LIPITOR) 40 MG tablet Take 40 mg by mouth daily.   Yes [provider]  b complex-vitamin c-folic acid (NEPHRO-VITE) 0.8 MG TABS tablet Take 1 tablet by mouth at bedtime.   Yes [provider]  Ketotifen Fumarate (ALLERGY EYE DROPS OP) Place 1 drop into both eyes daily as needed (allergies).   Yes [provider]  midodrine (PROAMATINE) 5 MG tablet Take 10 mg by mouth See admin instructions. Take 10 mg by twice daily on Tuesday, Thursday and Saturday 05/01/18  Yes [provider]  SENSIPAR 30 MG tablet Take 30 mg by mouth 3 (three) times a week. 08/02/22  Yes [provider]  sucroferric oxyhydroxide (VELPHORO) 500 MG chewable tablet Chew 1,000 mg by mouth 3 (three)  times daily with meals.    Yes [provider]  doxycycline (VIBRAMYCIN) 50 MG capsule Take 1 capsule (50 mg total) by mouth 2 (two) times daily. Patient not taking: Reported on 11/23/2022 09/12/22   Loel Dubonnet P, PA-C  Menthol, Topical Analgesic, (BIOFREEZE EX) Apply 1 Application topically daily as needed (pain). Patient not taking: Reported on 08/24/2023    [provider]  traMADol (ULTRAM) 50 MG tablet Take 1 tablet (50 mg total) by mouth every 6 (six) hours as needed. Patient not taking: Reported on 08/24/2023 09/02/22   Dara Lords, PA-C    Discontinued Meds:   There are no discontinued medications.   Social History:  reports that she quit smoking about 37 years ago. Her smoking use included cigarettes. She started smoking about 57 years ago. She has never used smokeless tobacco. She reports that she does not drink alcohol and does not use drugs.  Family History:  No family history on file.  Blood pressure 129/65, pulse (!) 110, resp. rate 14, height 5\' 4"  (1.626 m), weight 81.2 kg, SpO2 100%. General appearance: NAD Head: NCAT Eyes: negative Neck: no adenopathy, no carotid bruit, no JVD, supple, symmetrical, trachea midline, and thyroid not enlarged, symmetric, no tenderness/mass/nodules Resp: clear to auscultation bilaterally Cardio: regular rate and rhythm GI: soft, non-tender; bowel sounds normal; no masses,  no organomegaly Extremities: extremities normal, atraumatic, no cyanosis or edema Access: Rt BCF with marked arm swelling       Sakeena Teall, Len Blalock, MD 10/30/2023, 8:14 AM

## 2023-10-30 NOTE — Op Note (Signed)
.  [email or web address]

## 2023-10-30 NOTE — Op Note (Signed)
Decreased access flows as well as ipsilateral arm swelling; right brachiobasilic fistula which was transposed on September 02, 2022 by Dr. Randie Heinz.   Summary:  1)      The patient had successful angioplasty (10mm Mustang -> 12 mm Mustang FE ~15-18 atm) of significant  90% stenosis in the outflow innominate vein swing.  Also 70% subclavian vein stenosis that was 90% effaced with the 10 mm Mustang; that will require high-pressure 10 mm balloon to fully effaced but that is also at the pacemaker wire entry site.  2)      Axillary vein, fistula patent, 70% inflow vein stenosis not opened given already arm swelling ipsilateral to the access.  The inflow lesion is much much better than when she came to [place] and opened with a 7 mm balloon in November.  Inflow anastomosis is widely patent. 2)      Flows improved after outflow angioplasty. 3)      This right  BBT remains amenable to future percutaneous intervention as long as it remains patent at least 3 months.   Description of procedure: The arm was prepped and draped in the usual sterile fashion. The right upper arm brachial basilic fistula was cannulated (40981) with an 18G Angiocath needle directed in an antegrade direction in arterial limb of the fistula. A guidewire was inserted and exchanged for a 6Fr -> 7 Fr sheath. Contrast [phone]) injection via the side port of the sheath was performed. The angiogram of the fistula ([id number]) showed a patent body plus outflow basilic vein, 70% subclavian vein stenosis at the venotomy of the pacer wires, 90 to 100% innominate vein stenosis with very sluggish flows.   A 0.035 wire was then inserted through the sheath, manipulated and finally parked in the right atrium. A 10mm Mustang angioplasty balloon was then inserted over the guidewire and positioned at the right subclavian vein followed by innominate vein stenosis.  The percent of this effacement was achieved at the subclavian vein site and 100% effacement at the innominate  vein site.    We then had to advance a 12 x 4 Mustang angioplasty balloon over the wire to the level of the innominate vein stenosis and full balloon effacement was achieved at approximately 15 atm of pressure  Final angiogram showed 30% residual stenosis at the innominate vein site, 10% residual stenosis at the subclavian vein site with no evidence of extravasation or dissection.  Contrast was now easily seen emptying into the innominate vein, SVC, right atrium.  Flows are markedly improved.   Hemostasis: A 3-0 ethilon purse string suture was placed at the cannulation site on removal of the sheath.   Sedation: Versed 1  mg,  Fentanyl 25  mcg. Sedation time: 20 minutes   Contrast. 12 mL   Monitoring: Because of the patient's comorbid conditions and sedation during the procedure, continuous EKG monitoring and O2 saturation monitoring was performed throughout the procedure by the RN. There were no abnormal arrhythmias encountered.   Complications: None   Diagnoses: I87.1 Stricture of vein  N18.6 ESRD T82.858A Stricture of access   Procedure Coding:  [phone] Cannulation and angiogram of fistula, venous angioplasty (basilic vein outflow swing site)  [phone] Central venous angioplasty (innominate vein defined by CMS to be in the central circuit which is different than the dialysis access circuit) O9629 Contrast   Recommendations:  1. Continue to cannulate the fistula with 15G needles.  2. Refer for problems with flows/swelling. 3. Remove the suture next treatment.  Discharge: The patient was discharged home in stable condition. The patient was given education regarding the care of the dialysis access AVF and specific instructions in case of any problems.

## 2023-10-30 NOTE — Discharge Instructions (Signed)

## 2024-01-12 ENCOUNTER — Other Ambulatory Visit: Payer: Self-pay

## 2024-01-12 ENCOUNTER — Ambulatory Visit (HOSPITAL_COMMUNITY)
Admission: RE | Admit: 2024-01-12 | Discharge: 2024-01-12 | Disposition: A | Discharge status: Home / Self Care | Attending: Nephrology | Admitting: Nephrology

## 2024-01-12 ENCOUNTER — Encounter (HOSPITAL_COMMUNITY)
Admission: RE | Disposition: A | Discharge status: Home / Self Care | Payer: Self-pay | Source: Home / Self Care | Attending: Nephrology

## 2024-01-12 ENCOUNTER — Encounter (HOSPITAL_COMMUNITY): Payer: Self-pay | Admitting: Nephrology

## 2024-01-12 DIAGNOSIS — E1122 Type 2 diabetes mellitus with diabetic chronic kidney disease: Secondary | ICD-10-CM | POA: Diagnosis not present

## 2024-01-12 DIAGNOSIS — E1151 Type 2 diabetes mellitus with diabetic peripheral angiopathy without gangrene: Secondary | ICD-10-CM | POA: Diagnosis not present

## 2024-01-12 DIAGNOSIS — Y832 Surgical operation with anastomosis, bypass or graft as the cause of abnormal reaction of the patient, or of later complication, without mention of misadventure at the time of the procedure: Secondary | ICD-10-CM | POA: Diagnosis not present

## 2024-01-12 DIAGNOSIS — Z853 Personal history of malignant neoplasm of breast: Secondary | ICD-10-CM | POA: Insufficient documentation

## 2024-01-12 DIAGNOSIS — Z87891 Personal history of nicotine dependence: Secondary | ICD-10-CM | POA: Insufficient documentation

## 2024-01-12 DIAGNOSIS — Z950 Presence of cardiac pacemaker: Secondary | ICD-10-CM | POA: Diagnosis not present

## 2024-01-12 DIAGNOSIS — D631 Anemia in chronic kidney disease: Secondary | ICD-10-CM | POA: Diagnosis not present

## 2024-01-12 DIAGNOSIS — Z79899 Other long term (current) drug therapy: Secondary | ICD-10-CM | POA: Diagnosis not present

## 2024-01-12 DIAGNOSIS — T85858A Stenosis due to other internal prosthetic devices, implants and grafts, initial encounter: Secondary | ICD-10-CM | POA: Diagnosis present

## 2024-01-12 DIAGNOSIS — I132 Hypertensive heart and chronic kidney disease with heart failure and with stage 5 chronic kidney disease, or end stage renal disease: Secondary | ICD-10-CM | POA: Insufficient documentation

## 2024-01-12 DIAGNOSIS — N186 End stage renal disease: Secondary | ICD-10-CM | POA: Diagnosis not present

## 2024-01-12 DIAGNOSIS — Z992 Dependence on renal dialysis: Secondary | ICD-10-CM | POA: Diagnosis not present

## 2024-01-12 DIAGNOSIS — E785 Hyperlipidemia, unspecified: Secondary | ICD-10-CM | POA: Diagnosis not present

## 2024-01-12 HISTORY — PX: VENOUS ANGIOPLASTY: CATH118376

## 2024-01-12 HISTORY — PX: A/V SHUNT INTERVENTION: CATH118220

## 2024-01-12 SURGERY — A/V SHUNT INTERVENTION
Anesthesia: LOCAL | Laterality: Right

## 2024-01-12 MED ORDER — LIDOCAINE HCL (PF) 1 % IJ SOLN
INTRAMUSCULAR | Status: DC | PRN
  Administered 2024-01-12: 2 mL via INTRADERMAL

## 2024-01-12 MED ORDER — LIDOCAINE HCL (PF) 1 % IJ SOLN
INTRAMUSCULAR | Status: AC
  Filled 2024-01-12: qty 30

## 2024-01-12 MED ORDER — MIDAZOLAM HCL 2 MG/2ML IJ SOLN
INTRAMUSCULAR | Status: DC | PRN
  Administered 2024-01-12: 1 mg via INTRAVENOUS

## 2024-01-12 MED ORDER — HEPARIN (PORCINE) IN NACL 1000-0.9 UT/500ML-% IV SOLN
INTRAVENOUS | Status: DC | PRN
  Administered 2024-01-12: 500 mL

## 2024-01-12 MED ORDER — FENTANYL CITRATE (PF) 100 MCG/2ML IJ SOLN
INTRAMUSCULAR | Status: AC
  Filled 2024-01-12: qty 2

## 2024-01-12 MED ORDER — SODIUM CHLORIDE 0.9 % IV SOLN: INTRAVENOUS | Status: DC

## 2024-01-12 MED ORDER — MIDAZOLAM HCL 2 MG/2ML IJ SOLN
INTRAMUSCULAR | Status: AC
Start: 2024-01-12 — End: ?
  Filled 2024-01-12: qty 2

## 2024-01-12 MED ORDER — IODIXANOL 320 MG/ML IV SOLN
INTRAVENOUS | Status: DC | PRN
Start: 2024-01-12 — End: 2024-01-12
  Administered 2024-01-12: 10 mL

## 2024-01-12 MED ORDER — FENTANYL CITRATE (PF) 100 MCG/2ML IJ SOLN
INTRAMUSCULAR | Status: DC | PRN
  Administered 2024-01-12: 25 ug via INTRAVENOUS

## 2024-01-12 SURGICAL SUPPLY — 9 items
BAG SNAP BAND KOVER 36X36 (MISCELLANEOUS) ×2 IMPLANT
BALLN MUSTANG 12.0X40 75 (BALLOONS) ×2 IMPLANT
BALLOON MUSTANG 12.0X40 75 (BALLOONS) IMPLANT
CATH SLIP KMP 65CM 5FR (CATHETERS) IMPLANT
GUIDEWIRE ANGLED .035 180CM (WIRE) IMPLANT
PACK EP LF (CUSTOM PROCEDURE TRAY) IMPLANT
SHEATH PINNACLE R/O II 7F 4CM (SHEATH) IMPLANT
SYR MEDALLION 10ML (SYRINGE) IMPLANT
WIRE MICRO SET SILHO 5FR 7 (SHEATH) IMPLANT

## 2024-01-12 NOTE — H&P (Signed)
 Stacy Hammond is an 78 y.o. female.   Chief Complaint: Decreased access flows, right arm swelling ipsilateral to fistula HPI:  78 year old woman with past medical history significant for type 2 diabetes mellitus, hypertension, dyslipidemia, peripheral vascular disease, history of pacemaker on the right side, breast cancer left side and end-stage renal disease on hemodialysis.  She has a right brachiobasilic fistula that was created in 2023 and she has had multiple procedures to the access including 12 mm innominate and 10 mm subclavian vein angioplasty performed 3 months ago.  She is referred for decreased access flow/clearance and right arm swelling.  She denies any constitutional symptoms suggestive of infection and does not have any erythema or pain in her arm.  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   [employer]   Sleep apnea    Does not use CPAP   Stroke (HCC)    light   Thyroid disease     Past Surgical History:  Procedure Laterality Date   A/V FISTULAGRAM Right 11/28/2022   Procedure: A/V Fistulagram;  Surgeon: Maeola Harman, MD;  Location: [facility] INVASIVE CV LAB;  Service: Cardiovascular;  Laterality: Right;   A/V FISTULAGRAM Left 08/28/2023   Procedure: A/V Fistulagram;  Surgeon: Maeola Harman, MD;  Location: [facility] INVASIVE CV LAB;  Service: Vascular;  Laterality: Left;   A/V FISTULAGRAM N/A 10/30/2023   Procedure: A/V Fistulagram;   Surgeon: Ethelene Hal, MD;  Location: [facility] INVASIVE CV LAB;  Service: Cardiovascular;  Laterality: N/A;   ABDOMINAL HYSTERECTOMY     partial   ANKLE FUSION Right 2014?   done at [facility]   AV FISTULA PLACEMENT     AV FISTULA PLACEMENT Left 03/27/2020   Procedure: INSERTION OF LEFT UPPER ARM ARTERIOVENOUS (AV) GORE-TEX GRAFT ARM USING 4X7MM X 45 CM STRETCH GORETEX GRAFT;  Surgeon: Larina Earthly, MD;  Location: [facility] OR;  Service: Vascular;  Laterality: Left;   AV FISTULA PLACEMENT Right 04/01/2022   Procedure: RIGHT ARM ARTERIOVENOUS (AV) FISTULA  CREATION;  Surgeon: Maeola Harman, MD;  Location: [facility] OR;  Service: Vascular;  Laterality: Right;   BASCILIC VEIN TRANSPOSITION Right 09/02/2022   Procedure: RIGHT ARM SECOND STAGE BASILIC VEIN ARTERIOVENOUS FISTULA;  Surgeon: Maeola Harman, MD;  Location: [facility] OR;  Service: Vascular;  Laterality: Right;   BREAST LUMPECTOMY Left    COLONOSCOPY W/ POLYPECTOMY     EYE SURGERY Bilateral    Cataract   FISTULA SUPERFICIALIZATION Left 06/01/2018   Procedure: FISTULA PLICATION LEFT ARM;  Surgeon: Nada Libman, MD;  Location: [facility] OR;  Service: Vascular;  Laterality: Left;   INSERTION OF DIALYSIS CATHETER Left 03/27/2020   Procedure: INSERTION OF TUNNELED DIALYSIS CATHETER USING PALINDROME 23CM;  Surgeon: Larina Earthly, MD;  Location: [facility] OR;  Service: Vascular;  Laterality: Left;  PACEMAKER PLACEMENT  03/21/2015   [employer]   PERIPHERAL VASCULAR BALLOON ANGIOPLASTY Right 06/27/2022   Procedure: PERIPHERAL VASCULAR BALLOON ANGIOPLASTY;  Surgeon: Maeola Harman, MD;  Location: [facility] INVASIVE CV LAB;  Service: Cardiovascular;  Laterality: Right;   PERIPHERAL VASCULAR BALLOON ANGIOPLASTY  11/28/2022   Procedure: PERIPHERAL VASCULAR BALLOON ANGIOPLASTY;  Surgeon: Maeola Harman, MD;  Location: [facility] INVASIVE CV LAB;  Service: Cardiovascular;;  right AVF   PERIPHERAL VASCULAR BALLOON ANGIOPLASTY  10/30/2023   Procedure:  PERIPHERAL VASCULAR BALLOON ANGIOPLASTY;  Surgeon: Ethelene Hal, MD;  Location: [facility] INVASIVE CV LAB;  Service: Cardiovascular;;  Innominate Vein   REVISON OF ARTERIOVENOUS FISTULA Left 09/14/2016   Procedure: LEFT CEPHALIC TURNDOWN;  Surgeon: Fransisco Hertz, MD;  Location: [facility] OR;  Service: Vascular;  Laterality: Left;    No family history on file. Social History:  reports that she quit smoking about 37 years ago. Her smoking use included cigarettes. She started smoking about 57 years ago. She has never used smokeless tobacco. She reports that she does not drink alcohol and does not use drugs.  Allergies:  Allergies  Allergen Reactions   Penicillins Hives    Has patient had a PCN reaction causing immediate rash, facial/tongue/throat swelling, SOB or lightheadedness with hypotension: No Has patient had a PCN reaction causing severe rash involving mucus membranes or skin necrosis: No Has patient had a PCN reaction that required hospitalization: No Has patient had a PCN reaction occurring within the last 10 years: Yes If all of the above answers are "NO", then may proceed with Cephalosporin use.    Oxycodone Nausea And Vomiting    Medications Prior to Admission  Medication Sig Dispense Refill   acetaminophen (TYLENOL) 500 MG tablet Take 1,000 mg by mouth every 6 (six) hours as needed for headache or moderate pain.     amiodarone (PACERONE) 100 MG tablet Take 100 mg by mouth daily.     apixaban (ELIQUIS) 2.5 MG TABS tablet Take 2.5 mg by mouth 2 (two) times daily.     atorvastatin (LIPITOR) 40 MG tablet Take 40 mg by mouth daily.     b complex-vitamin c-folic acid (NEPHRO-VITE) 0.8 MG TABS tablet Take 1 tablet by mouth at bedtime.     doxycycline (VIBRAMYCIN) 50 MG capsule Take 1 capsule (50 mg total) by mouth 2 (two) times daily. (Patient not taking: Reported on 11/23/2022) 14 capsule 0   Ketotifen Fumarate (ALLERGY EYE DROPS OP) Place 1 drop into both eyes daily as needed (allergies).      Menthol, Topical Analgesic, (BIOFREEZE EX) Apply 1 Application topically daily as needed (pain). (Patient not taking: Reported on 08/24/2023)     midodrine (PROAMATINE) 5 MG tablet Take 10 mg by mouth See admin instructions. Take 10 mg by twice daily on Tuesday, Thursday and Saturday  2   SENSIPAR 30 MG tablet Take 30 mg by mouth 3 (three) times a week.     sucroferric oxyhydroxide (VELPHORO) 500 MG chewable tablet Chew 1,000 mg by mouth 3 (three) times daily with meals.      traMADol (ULTRAM) 50 MG tablet Take 1 tablet (50 mg total) by mouth every 6 (six) hours as needed. (Patient not taking: Reported on 08/24/2023) 20 tablet 0    No results found for this or any previous visit (from the past 48 hours). No results found.  Review of Systems  All other systems reviewed and are negative.   Blood pressure 112/84, pulse 93, temperature 98.1 F (36.7  C), temperature source Oral, resp. rate 14, height 5' 4.02" (1.626 m), weight 79.8 kg, SpO2 96%. Physical Exam Vitals reviewed.  Constitutional:      General: She is not in acute distress.    Appearance: Normal appearance. She is obese.  HENT:     Head: Normocephalic and atraumatic.     Nose: Nose normal.     Mouth/Throat:     Mouth: Mucous membranes are dry.     Pharynx: Oropharynx is clear.  Eyes:     Extraocular Movements: Extraocular movements intact.     Conjunctiva/sclera: Conjunctivae normal.  Cardiovascular:     Rate and Rhythm: Normal rate and regular rhythm.     Heart sounds: Normal heart sounds.  Musculoskeletal:     Comments: Right brachiobasilic fistula that is mildly hyper pulsatile.  Significant right arm swelling with pitting edema ipsilateral to fistula.  Low pitched outflow bruit.  Skin:    General: Skin is warm and dry.  Neurological:     Mental Status: She is alert.      Assessment/Plan 1.  Decreased access flows/right arm swelling: This is ipsilateral to her brachiobasilic fistula/PPM and raises concern for  central vein stenosis.  No evidence of cellulitis.  Will undertake fistulogram to decide on additional management including angioplasty.  The patient consented to the procedure after weighing risks and benefits. 2.  End-stage renal disease: Resume hemodialysis on TTS schedule following completion of procedure today. 3.  Hypertension: Blood pressure currently at goal, will be monitored carefully with moderate sedation during procedure. 4.  Anemia: No overt bleed. To resume H/H monitoring and ESA therapy at outpatient dialysis.  Dagoberto Ligas, MD 01/12/2024, 9:23 AM

## 2024-01-12 NOTE — Op Note (Signed)
 Patient referred for evaluation and management of access flows as well as ipsilateral arm swelling with a right brachiobasilic fistula which was transposed on September 02, 2022 by Dr. Randie Heinz.   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  2)      Patent body of the brachiobasilic fistula, outflow axillary vein and with 50% inflow basilic vein stenosis (not treated because of central vein stenosis and risk of worsening arm swelling/venous hypertension). 2)      Flows improved after outflow angioplasty. 3)      This right  BBT remains amenable to future percutaneous intervention as long as it remains patent at least 3 months.   Description of procedure: The arm was prepped and draped in the usual sterile fashion. The right upper arm brachial basilic fistula was cannulated (40981) with a a 21G micropuncture needle directed in an antegrade direction in arterial limb of the fistula. A guidewire was inserted and exchanged for a 7 Fr sheath. Contrast [phone]) injection via the side port of the sheath was performed. 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 A 12 x 40 mm Mustang angioplasty balloon was inserted over the guidewire and positioned FIRST at the right innominate vein stenosis followed by the subclavian vein stenosis.  Central venous angioplasty (21308) was carried out 90%  effacement at the subclavian vein site (~18 ATM pressure via hand syringe assembly) and 100% effacement at the innominate vein site (~16 ATM pressure via hand syringe assembly).     Final angiogram showed 30% residual stenosis at the innominate vein site, 10% residual stenosis at the subclavian vein site with no evidence of extravasation or dissection.  Contrast was now easily seen emptying into the innominate vein, SVC, right atrium.  Flows are markedly improved.   Hemostasis: A 3-0 ethilon purse string suture was placed at the cannulation site on removal of the sheath.   Sedation: Versed 1  mg,  Fentanyl 25  mcg. Sedation time: 16 minutes   Contrast. 10 mL   Monitoring: Because of the patient's comorbid conditions and sedation during the procedure, continuous EKG monitoring and O2 saturation monitoring was performed throughout the procedure by the RN. There were no abnormal arrhythmias encountered.   Complications: None   Diagnoses: I87.1 Stricture of vein  N18.6 ESRD R22.31 Localized swelling, mass and lump right upper limb   Procedure Coding:  36901 Cannulation and angiogram of fistula, angiogram of dialysis access [phone] Central venous angioplasty (subclavian and innominate veins) O9629 Contrast   Recommendations:  1. Continue to cannulate the fistula with 15G needles.  2. Refer for problems with flows/swelling. 3. Remove the suture next treatment.    Discharge: The patient was discharged home in stable condition. The patient was given education regarding the care of the dialysis access AVF and specific instructions in case of any problems.

## 2024-03-18 ENCOUNTER — Other Ambulatory Visit: Payer: Self-pay

## 2024-03-18 ENCOUNTER — Encounter (HOSPITAL_COMMUNITY)
Admission: RE | Disposition: A | Discharge status: Home / Self Care | Payer: Self-pay | Source: Home / Self Care | Attending: Vascular Surgery

## 2024-03-18 ENCOUNTER — Other Ambulatory Visit: Payer: Self-pay | Admitting: Vascular Surgery

## 2024-03-18 ENCOUNTER — Ambulatory Visit (HOSPITAL_COMMUNITY)
Admission: RE | Admit: 2024-03-18 | Discharge: 2024-03-18 | Disposition: A | Discharge status: Home / Self Care | Attending: Vascular Surgery | Admitting: Vascular Surgery

## 2024-03-18 DIAGNOSIS — I509 Heart failure, unspecified: Secondary | ICD-10-CM | POA: Insufficient documentation

## 2024-03-18 DIAGNOSIS — T82898A Other specified complication of vascular prosthetic devices, implants and grafts, initial encounter: Secondary | ICD-10-CM

## 2024-03-18 DIAGNOSIS — Z950 Presence of cardiac pacemaker: Secondary | ICD-10-CM | POA: Diagnosis not present

## 2024-03-18 DIAGNOSIS — I871 Compression of vein: Secondary | ICD-10-CM | POA: Diagnosis not present

## 2024-03-18 DIAGNOSIS — E1122 Type 2 diabetes mellitus with diabetic chronic kidney disease: Secondary | ICD-10-CM | POA: Insufficient documentation

## 2024-03-18 DIAGNOSIS — N186 End stage renal disease: Secondary | ICD-10-CM | POA: Diagnosis not present

## 2024-03-18 DIAGNOSIS — Y832 Surgical operation with anastomosis, bypass or graft as the cause of abnormal reaction of the patient, or of later complication, without mention of misadventure at the time of the procedure: Secondary | ICD-10-CM | POA: Diagnosis not present

## 2024-03-18 DIAGNOSIS — T82858A Stenosis of vascular prosthetic devices, implants and grafts, initial encounter: Secondary | ICD-10-CM | POA: Insufficient documentation

## 2024-03-18 DIAGNOSIS — Z992 Dependence on renal dialysis: Secondary | ICD-10-CM | POA: Diagnosis not present

## 2024-03-18 DIAGNOSIS — Z87891 Personal history of nicotine dependence: Secondary | ICD-10-CM | POA: Diagnosis not present

## 2024-03-18 DIAGNOSIS — I132 Hypertensive heart and chronic kidney disease with heart failure and with stage 5 chronic kidney disease, or end stage renal disease: Secondary | ICD-10-CM | POA: Diagnosis not present

## 2024-03-18 HISTORY — PX: A/V SHUNT INTERVENTION: CATH118220

## 2024-03-18 HISTORY — PX: VENOUS ANGIOPLASTY: CATH118376

## 2024-03-18 SURGERY — A/V SHUNT INTERVENTION
Anesthesia: LOCAL | Site: Arm Upper | Laterality: Right

## 2024-03-18 MED ORDER — IODIXANOL 320 MG/ML IV SOLN
INTRAVENOUS | Status: DC | PRN
  Administered 2024-03-18: 25 mL via INTRAVENOUS

## 2024-03-18 MED ORDER — HEPARIN SODIUM (PORCINE) 1000 UNIT/ML IJ SOLN
INTRAMUSCULAR | Status: AC
  Filled 2024-03-18: qty 10

## 2024-03-18 MED ORDER — HEPARIN (PORCINE) IN NACL 1000-0.9 UT/500ML-% IV SOLN
INTRAVENOUS | Status: DC | PRN
  Administered 2024-03-18: 500 mL

## 2024-03-18 MED ORDER — LIDOCAINE HCL (PF) 1 % IJ SOLN
INTRAMUSCULAR | Status: DC | PRN
  Administered 2024-03-18: 2 mL via INTRADERMAL

## 2024-03-18 MED ORDER — LIDOCAINE HCL (PF) 1 % IJ SOLN
INTRAMUSCULAR | Status: AC
  Filled 2024-03-18: qty 30

## 2024-03-18 SURGICAL SUPPLY — 10 items
BALLOON MUSTANG 12.0X40 75 (BALLOONS) IMPLANT
CATH SLIP KMP 65CM 5FR (CATHETERS) IMPLANT
KIT ENCORE 26 ADVANTAGE (KITS) IMPLANT
KIT MICROPUNCTURE NIT STIFF (SHEATH) IMPLANT
KIT PV (KITS) ×2 IMPLANT
SHEATH PINNACLE R/O II 7F 4CM (SHEATH) IMPLANT
SHEATH PROBE COVER 6X72 (BAG) IMPLANT
TRAY PV CATH (CUSTOM PROCEDURE TRAY) ×2 IMPLANT
TUBING CIL FLEX 10 FLL-RA (TUBING) IMPLANT
WIRE BENTSON .035X145CM (WIRE) IMPLANT

## 2024-03-18 NOTE — H&P (Signed)
 [facility] [place]  ASSESSMENT / PLAN: 78 y.o. female with end-stage renal disease dialyzing through a right arm brachiobasilic arteriovenous fistula.  The fistula is alarming on the dialysis machine.  She is referred for fistulogram for low flows.  She does have a history of right-sided pacemaker and previous interventions in the brachiocephalic vein and subclavian vein.  Plan fistulogram today.  CHIEF COMPLAINT: End-stage renal disease  HISTORY OF PRESENT ILLNESS: Stacy Hammond is a 78 y.o. female referred to outpatient dialysis access center for evaluation of low flows on dialysis circuit. Patient has right sided brachiobasilic AVF. She has a right sided pacemaker as well. She has prevously undergone interventions on right subclavian and SVC in the past. She has no complaints today.    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   [employer]   Sleep apnea    Does not use CPAP   Stroke (HCC)    light   Thyroid disease     Past Surgical History:  Procedure Laterality Date   A/V FISTULAGRAM Right 11/28/2022   Procedure: A/V Fistulagram;  Surgeon: Adine Hoof, MD;  Location: [facility] INVASIVE CV LAB;  Service: Cardiovascular;  Laterality: Right;   A/V FISTULAGRAM Left 08/28/2023   Procedure: A/V Fistulagram;  Surgeon: Adine Hoof, MD;  Location: [facility] INVASIVE CV LAB;  Service: Vascular;  Laterality: Left;   A/V  FISTULAGRAM N/A 10/30/2023   Procedure: A/V Fistulagram;  Surgeon: Patrick Boor, MD;  Location: [facility] INVASIVE CV LAB;  Service: Cardiovascular;  Laterality: N/A;   A/V SHUNT INTERVENTION N/A 01/12/2024   Procedure: A/V SHUNT INTERVENTION;  Surgeon: Melodie Spry, MD;  Location: [facility] INVASIVE CV LAB;  Service: Cardiovascular;  Laterality: N/A;   ABDOMINAL HYSTERECTOMY     partial   ANKLE FUSION Right 2014?   done at [facility]   AV FISTULA PLACEMENT     AV FISTULA PLACEMENT Left 03/27/2020   Procedure: INSERTION OF LEFT UPPER ARM ARTERIOVENOUS (AV) GORE-TEX GRAFT ARM USING 4X7MM X 45 CM STRETCH GORETEX GRAFT;  Surgeon: Mayo Speck, MD;  Location: [facility] OR;  Service: Vascular;  Laterality: Left;   AV FISTULA PLACEMENT Right 04/01/2022   Procedure: RIGHT ARM ARTERIOVENOUS (AV) FISTULA  CREATION;  Surgeon: Adine Hoof, MD;  Location: [facility] OR;  Service: Vascular;  Laterality: Right;   BASCILIC VEIN TRANSPOSITION Right 09/02/2022   Procedure: RIGHT ARM SECOND STAGE BASILIC VEIN ARTERIOVENOUS FISTULA;  Surgeon: Adine Hoof, MD;  Location: [facility] OR;  Service: Vascular;  Laterality: Right;   BREAST LUMPECTOMY Left    COLONOSCOPY W/ POLYPECTOMY     EYE SURGERY Bilateral    Cataract   FISTULA SUPERFICIALIZATION Left 06/01/2018   Procedure: FISTULA PLICATION LEFT ARM;  Surgeon: Margherita Shell, MD;  Location: [facility] OR;  Service: Vascular;  Laterality: Left;   INSERTION OF DIALYSIS CATHETER Left 03/27/2020   Procedure: INSERTION OF TUNNELED DIALYSIS CATHETER USING PALINDROME 23CM;  Surgeon: Mayo Speck, MD;  Location: [facility] OR;  Service: Vascular;  Laterality: Left;   PACEMAKER PLACEMENT  03/21/2015   [employer]   PERIPHERAL VASCULAR BALLOON ANGIOPLASTY Right 06/27/2022   Procedure: PERIPHERAL VASCULAR BALLOON ANGIOPLASTY;  Surgeon: Adine Hoof, MD;  Location: [facility] INVASIVE CV LAB;  Service: Cardiovascular;  Laterality: Right;   PERIPHERAL VASCULAR BALLOON ANGIOPLASTY   11/28/2022   Procedure: PERIPHERAL VASCULAR BALLOON ANGIOPLASTY;  Surgeon: Adine Hoof, MD;  Location: [facility] INVASIVE CV LAB;  Service: Cardiovascular;;  right AVF   PERIPHERAL VASCULAR BALLOON ANGIOPLASTY  10/30/2023   Procedure: PERIPHERAL VASCULAR BALLOON ANGIOPLASTY;  Surgeon: Patrick Boor, MD;  Location: [facility] INVASIVE CV LAB;  Service: Cardiovascular;;  Innominate Vein   REVISON OF ARTERIOVENOUS FISTULA Left 09/14/2016   Procedure: LEFT CEPHALIC TURNDOWN;  Surgeon: Arvil Lauber, MD;  Location: [facility] OR;  Service: Vascular;  Laterality: Left;   VENOUS ANGIOPLASTY Right 01/12/2024   Procedure: VENOUS ANGIOPLASTY;  Surgeon: Melodie Spry, MD;  Location: [facility] INVASIVE CV LAB;  Service: Cardiovascular;  Laterality: Right;  innominate and subclavian    No family history on file.  Social History   Socioeconomic History   Marital status: Single    Spouse name: Not on file   Number of children: Not on file   Years of education: Not on file   Highest education level: Not on file  Occupational History   Not on file  Tobacco Use   Smoking status: Former    Current packs/day: 0.00    Types: Cigarettes    Start date: 09/09/1966    Quit date: 09/09/1986    Years since quitting: 37.5   Smokeless tobacco: Never  Vaping Use   Vaping status: Never Used  Substance and Sexual Activity   Alcohol use: No   Drug use: No   Sexual activity: Not Currently    Birth control/protection: Surgical    Comment: Hysterectomy  Other Topics Concern   Not on file  Social History Narrative   Not on file   Social Drivers of Health   Financial Resource Strain: Low Risk  (04/20/2022)   Received from [facility]   Overall Financial Resource Strain (CARDIA)    Difficulty of Paying Living Expenses: Not hard at all  Food Insecurity: No Food Insecurity (06/16/2023)   Received from [facility]   Hunger Vital Sign    Within the past 12 months, you worried that your food would run out before you got the  money to buy more.: Never true    Within the past 12 months, the food you bought just didn't last and you didn't have money to get more.: Never true  Transportation Needs: No Transportation Needs (06/16/2023)   Received from [facility]   PRAPARE - Transportation    Lack of Transportation (Medical): No    Lack of Transportation (Non-Medical): No  Physical Activity: Inactive (04/20/2022)   Received from [facility]   Exercise Vital Sign    On average, how many days per week do you engage in moderate to strenuous exercise (like a brisk walk)?: 0 days    On average, how many minutes do you engage in exercise at this level?: 0 min  Stress: No Stress Concern Present (04/20/2022)   Received from [facility]  Occupational Stress Questionnaire    Feeling of Stress : Not at all  Social Connections: Moderately Integrated (04/20/2022)   Received from [facility]   Social Connection and Isolation Panel    In a typical week, how many times do you talk on the phone with family, friends, or neighbors?: More than three times a week    How often do you get together with friends or relatives?: More than three times a week    How often do you attend church or religious services?: More than 4 times per year    Do you belong to any clubs or organizations such as church groups, unions, fraternal or athletic groups, or school groups?: No    How often do you attend meetings of the clubs or organizations you belong to?: More than 4 times per year    Are you married, widowed, divorced, separated, never married, or living with a partner?: Never married  Intimate Partner Violence: Not At Risk (04/20/2022)   Received from [facility]   Humiliation, Afraid, Rape, and Kick questionnaire    Within the last year, have you been afraid of your partner or ex-partner?: No    Within the last year, have you been humiliated or emotionally abused in other ways by your  partner or ex-partner?: No    Within the last year, have you been kicked, hit, slapped, or otherwise physically hurt by your partner or ex-partner?: No    Within the last year, have you been raped or forced to have any kind of sexual activity by your partner or ex-partner?: No    Allergies  Allergen Reactions   Penicillins Hives    Has patient had a PCN reaction causing immediate rash, facial/tongue/throat swelling, SOB or lightheadedness with hypotension: No Has patient had a PCN reaction causing severe rash involving mucus membranes or skin necrosis: No Has patient had a PCN reaction that required hospitalization: No Has patient had a PCN reaction occurring within the last 10 years: Yes If all of the above answers are NO, then may proceed with Cephalosporin use.    Oxycodone  Nausea And Vomiting    Current Facility-Administered Medications  Medication Dose Route Frequency Provider Last Rate Last Admin   Heparin  (Porcine) in NaCl 1000-0.9 UT/500ML-% SOLN    PRN Carlene Che, MD   500 mL at 03/18/24 1041   lidocaine  (PF) (XYLOCAINE ) 1 % injection    PRN Carlene Che, MD   2 mL at 03/18/24 1044    PHYSICAL EXAM Vitals:   03/18/24 1014 03/18/24 1040  BP: (!) 141/101   Pulse: (!) 116   Resp: 12   Temp: 98.2 F (36.8 C)   TempSrc: Oral   SpO2: 98% 94%   Elderly woman in no distress Mild tachycardia Unlabored breathing Thrill in R arm BB AVF  PERTINENT LABORATORY AND RADIOLOGIC DATA  Most recent CBC    Latest Ref Rng & Units 11/28/2022    1:47 PM 09/02/2022    1:44 PM 09/02/2022    1:28 PM  CBC  Hemoglobin 12.0 - 15.0 g/dL 13.0  86.5  78.4   Hematocrit 36.0 - 46.0 % 46.0  44.0  44.0      Most recent CMP    Latest Ref Rng & Units 11/28/2022    1:47 PM 09/02/2022    1:44 PM 09/02/2022    1:33 PM  CMP  Glucose 70 - 99 mg/dL 87  696  295   BUN 8 -  23 mg/dL 55  34  32   Creatinine 0.44 - 1.00 mg/dL 09.81  19.14  7.82   Sodium 135 - 145 mmol/L 139  138  138    Potassium 3.5 - 5.1 mmol/L 5.0  4.0  4.1   Chloride 98 - 111 mmol/L 100  100  99   CO2 22 - 32 mmol/L   26   Calcium 8.9 - 10.3 mg/dL   9.7    Lillyen Schow N. Edgardo Goodwill, MD FACS [facility] Phone Number: [phone] 03/18/2024 11:03 AM   Total time spent on preparing this encounter including chart review, data review, collecting history, examining the patient, and coordinating care: 30 minutes  Portions of this report may have been transcribed using voice recognition software.  Every effort has been made to ensure accuracy; however, inadvertent computerized transcription errors may still be present.

## 2024-03-18 NOTE — Op Note (Signed)
 DATE OF SERVICE: 03/18/2024  PATIENT:  Stacy Hammond  78 y.o. female  PRE-OPERATIVE DIAGNOSIS:  ESRD, low flows on dialysis circuit  POST-OPERATIVE DIAGNOSIS:  Same  PROCEDURE:   1) ultrasound guided right arm arteriovenous fistula access (CPT [phone]) 2) Right arm fistulagram 3) Right subclavian vein angioplasty (12x16mm Mustang) (CPT [phone]) 4) Superior vena cava angioplasty (12x70mm Mustang) (CPT [phone])  SURGEON:  Surgeons and Role:    * Carlene Che, MD - Primary  ASSISTANT: none  ANESTHESIA:   local  EBL: minimal  BLOOD ADMINISTERED:none  DRAINS: none   LOCAL MEDICATIONS USED:  LIDOCAINE    SPECIMEN:  none  COUNTS: confirmed correct.  TOURNIQUET:  none  PATIENT DISPOSITION:  PACU - hemodynamically stable.   Delay start of Pharmacological VTE agent (>24hrs) due to surgical blood loss or risk of bleeding: no  INDICATION FOR PROCEDURE: Stacy Hammond is a 78 y.o. female with ESRD. She dialyzes via right brachiobasilic AVF. Her dialysis center has noted low flows from her access. The dialysis center has requested fistulagram. After careful discussion of risks, benefits, and alternatives the patient was offered fistulagram.  The patient understood and wished to proceed.  OPERATIVE FINDINGS: Right sided pacemaker causing stenosis in SVC, confluence of innominate vein and SVC, and subclavian vein (all >80%). No axillary vein stenosis. No stenosis in AVF. Fistula anastomosis and brachial artery not seen on reflux angiogram. Resolution of stenosis (30% residual) after angioplasty.   DESCRIPTION OF PROCEDURE: After identification of the patient in the pre-operative holding area, the patient was transferred to the operating room. The patient was positioned supine on the operating room table. Anesthesia was induced. The right arm was prepped and draped in standard fashion. A surgical pause was performed confirming correct patient, procedure, and operative location.  The right  arm fistula was accessed with micropuncture technique using duplex guidance. Using seldinger technique, a micro sheath was introduced into the fistula. Fistulagram was performed in stations. A benson wire was then introduced into the access. Access was upsized to a 56F short sheath. The lesion was crossed with the assistance of a KMP catheter. Angioplasty of the SVC, SVC/innominate confluence, and subclavian vein was performed with a 12 x 40mm Mustang balloon. Good result was achieved on follow up angiogram. All endovascular equipment was removed. A figure of eight stitch was applied to the access, rendering it hemostatic.  Upon completion of the case instrument and sharps counts were confirmed correct. The patient was transferred to the PACU in good condition. I was present for all portions of the procedure.  FOLLOW UP PLAN: OK to use fistula. Endovascular re-intervention appropriate. Needs discussion with cardiology RE: re-siting pacemaker, but this may not be feasible. Follow up with us  as needed.  Heber Little. Edgardo Goodwill, MD [facility] Phone Number: [phone] 03/18/2024 8:29 PM

## 2024-03-19 ENCOUNTER — Encounter (HOSPITAL_COMMUNITY): Payer: Self-pay | Admitting: Vascular Surgery

## 2024-03-22 ENCOUNTER — Telehealth: Payer: Self-pay | Admitting: Cardiology

## 2024-03-22 NOTE — Telephone Encounter (Signed)
   Patient was scheduled with me on 7/2 for a heart first appointment.  I reviewed the chart.  Patient was referred due to right sided pacemaker causing recurrent stenosis in fistula.  Patient currently followed by a cardiologist in [place] through the [facility] system.   I called patient's daughter to clarify if they wanted to a new cardiologist and reason for referral.  Patient's daughter reports that the cardiologist in [facility] is about 1.5 hours away.  She can only be seen by their office on dialysis days and she is often too weak to make the 1.5-hour drive.  Our cardiology practice would be about 45 minutes away so they are interested in transferring care.  Daughter informed me that patient previously had her AV fistula on her left side, so when she had her pacemaker put in it was put on the right.  Her left fistula is no longer working, recently had a right AV fistula created.  The pacemaker being on the right side has been causing issues with the right AV fistula, and her vascular surgeon would like pacemaker to be moved if possible.   Discussed that patient will likely need to be seen by an EP provider. As I am with general cardiology, it is not within my scope to address moving a pacemaker. I told daughter that I would try to get her rescheduled with an EP provider to discuss PPM and that our office will contact her to arrange appointment.   Debria Fang, PA-C 03/22/2024 10:31 AM

## 2024-04-03 ENCOUNTER — Ambulatory Visit: Admitting: Cardiology

## 2024-04-24 ENCOUNTER — Encounter (HOSPITAL_COMMUNITY)
Admission: RE | Disposition: A | Discharge status: Home / Self Care | Payer: Self-pay | Source: Home / Self Care | Attending: Vascular Surgery

## 2024-04-24 ENCOUNTER — Ambulatory Visit (HOSPITAL_COMMUNITY)
Admission: RE | Admit: 2024-04-24 | Discharge: 2024-04-24 | Disposition: A | Discharge status: Home / Self Care | Attending: Vascular Surgery | Admitting: Vascular Surgery

## 2024-04-24 ENCOUNTER — Encounter (HOSPITAL_COMMUNITY): Payer: Self-pay | Admitting: Vascular Surgery

## 2024-04-24 ENCOUNTER — Other Ambulatory Visit: Payer: Self-pay

## 2024-04-24 DIAGNOSIS — N186 End stage renal disease: Secondary | ICD-10-CM | POA: Diagnosis not present

## 2024-04-24 DIAGNOSIS — I509 Heart failure, unspecified: Secondary | ICD-10-CM | POA: Insufficient documentation

## 2024-04-24 DIAGNOSIS — Y832 Surgical operation with anastomosis, bypass or graft as the cause of abnormal reaction of the patient, or of later complication, without mention of misadventure at the time of the procedure: Secondary | ICD-10-CM | POA: Diagnosis not present

## 2024-04-24 DIAGNOSIS — T82858A Stenosis of vascular prosthetic devices, implants and grafts, initial encounter: Secondary | ICD-10-CM | POA: Diagnosis present

## 2024-04-24 DIAGNOSIS — Z87891 Personal history of nicotine dependence: Secondary | ICD-10-CM | POA: Insufficient documentation

## 2024-04-24 DIAGNOSIS — Z992 Dependence on renal dialysis: Secondary | ICD-10-CM | POA: Diagnosis not present

## 2024-04-24 DIAGNOSIS — I132 Hypertensive heart and chronic kidney disease with heart failure and with stage 5 chronic kidney disease, or end stage renal disease: Secondary | ICD-10-CM | POA: Insufficient documentation

## 2024-04-24 DIAGNOSIS — Z950 Presence of cardiac pacemaker: Secondary | ICD-10-CM

## 2024-04-24 DIAGNOSIS — E1122 Type 2 diabetes mellitus with diabetic chronic kidney disease: Secondary | ICD-10-CM | POA: Diagnosis not present

## 2024-04-24 HISTORY — PX: A/V SHUNT INTERVENTION: CATH118220

## 2024-04-24 HISTORY — PX: VENOUS ANGIOPLASTY: CATH118376

## 2024-04-24 LAB — GLUCOSE, CAPILLARY: Glucose-Capillary: 84 mg/dL (ref 70–99)

## 2024-04-24 SURGERY — A/V SHUNT INTERVENTION
Anesthesia: LOCAL | Site: Arm Upper | Laterality: Right

## 2024-04-24 MED ORDER — IODIXANOL 320 MG/ML IV SOLN
INTRAVENOUS | Status: DC | PRN
Start: 2024-04-24 — End: 2024-04-24
  Administered 2024-04-24: 30 mL via INTRAVENOUS

## 2024-04-24 MED ORDER — LIDOCAINE HCL (PF) 1 % IJ SOLN
INTRAMUSCULAR | Status: DC | PRN
Start: 2024-04-24 — End: 2024-04-24
  Administered 2024-04-24: 5 mL

## 2024-04-24 MED ORDER — FENTANYL CITRATE (PF) 100 MCG/2ML IJ SOLN
INTRAMUSCULAR | Status: AC
Start: 2024-04-24 — End: 2024-04-24
  Filled 2024-04-24: qty 2

## 2024-04-24 MED ORDER — HEPARIN (PORCINE) IN NACL 1000-0.9 UT/500ML-% IV SOLN
INTRAVENOUS | Status: DC | PRN
  Administered 2024-04-24: 500 mL

## 2024-04-24 MED ORDER — LIDOCAINE HCL (PF) 1 % IJ SOLN
INTRAMUSCULAR | Status: AC
  Filled 2024-04-24: qty 30

## 2024-04-24 MED ORDER — MIDAZOLAM HCL 2 MG/2ML IJ SOLN
INTRAMUSCULAR | Status: AC
  Filled 2024-04-24: qty 2

## 2024-04-24 MED ORDER — MIDAZOLAM HCL 2 MG/2ML IJ SOLN
INTRAMUSCULAR | Status: DC | PRN
  Administered 2024-04-24: 1 mg via INTRAVENOUS

## 2024-04-24 MED ORDER — FENTANYL CITRATE (PF) 100 MCG/2ML IJ SOLN
INTRAMUSCULAR | Status: DC | PRN
  Administered 2024-04-24: 50 ug via INTRAVENOUS

## 2024-04-24 SURGICAL SUPPLY — 8 items
BALLOON MUSTANG 12.0X40 75 (BALLOONS) IMPLANT
KIT ENCORE 26 ADVANTAGE (KITS) IMPLANT
KIT MICROPUNCTURE NIT STIFF (SHEATH) IMPLANT
SHEATH PINNACLE R/O II 7F 4CM (SHEATH) IMPLANT
SHEATH PROBE COVER 6X72 (BAG) IMPLANT
TRAY PV CATH (CUSTOM PROCEDURE TRAY) ×2 IMPLANT
TUBING CIL FLEX 10 FLL-RA (TUBING) IMPLANT
WIRE BENTSON .035X145CM (WIRE) IMPLANT

## 2024-04-24 NOTE — Op Note (Addendum)
    Patient name: Stacy Hammond MRN: [id number] DOB: 10-05-1945 Sex: female  04/24/2024 Pre-operative Diagnosis: ESRD on HD Post-operative diagnosis:  Same Surgeon:  Norman GORMAN Serve, MD Procedure Performed:  Ultrasound-guided access of right arm brachiocephalic fistula Fistulogram and central venogram Balloon angioplasty of right subclavian vein, 12 mm x 40 mm Mustang balloon 16 minutes of moderate sedation with fentanyl  and Versed   Indications: [name] is a 78 year old female with ESRD on HD.  She has been having with low flows during dialysis sessions.  She recently underwent subclavian vein angioplasty by Dr. Magda on June 16.  She presents today she has a center today for a repeat fistulogram with possible intervention.  Risks and benefits were reviewed and she elected to proceed.  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  Widely patent axillary vein and distal portion of brachiocephalic fistula.  The proximal portion and anastomosis are severely stenotic, not amenable to endovascular treatment.   Procedure:  The patient was identified in the holding area and taken to the cath lab  The patient was then placed supine on the table and prepped and draped in the usual sterile fashion.  A time out was called.  Ultrasound was used to evaluate the right arm AV access. This was accessed under u/s guidance. An 018 wire was advanced without resistance, a micropuncture sheath was placed and fistulagram obtained which demonstrated the above findings.  This access was then upsized to a 6 F short sheath over a glidewire.  The wire was then used to cross the subclavian stenosis and this was treated with a 12 mm x 40 mm Mustang balloon.  The wire and sheath were then removed and the access was managed with a 4 Monocryl figure-of-eight suture for hemostasis.  Contrast: 30 cc Sedation: 16 minutes  Impression: Mild improvement of subclavian vein stenosis  associated with pacemaker wires.  The proximal aspect of the fistula and the anastomosis worse severely stenotic but not amenable to endovascular treatment.  Given that the access had been working and she underwent dialysis yesterday I explained that she can continue to use this access as long as it last but would likely eventually need new permanent access as I believe the fistula will fail in the future due to the significant disease in the proximal portion.   Norman GORMAN Serve MD [facility] [place] Office: [phone]

## 2024-04-24 NOTE — H&P (Signed)
 HD ACCESS CENTER H&P   Patient ID: Stacy Hammond, female   DOB: 1946/06/09, 78 y.o.   MRN: [id number]  Subjective:     HPI Stacy Hammond is a 78 y.o. female with ESRD presenting to the HD access center for intervention.  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   [employer]   Sleep apnea    Does not use CPAP   Stroke (HCC)    light   Thyroid disease    No family history on file. Past Surgical History:  Procedure Laterality Date   A/V FISTULAGRAM Right 11/28/2022   Procedure: A/V Fistulagram;  Surgeon: Sheree Penne Bruckner, MD;  Location: [facility] INVASIVE CV LAB;  Service: Cardiovascular;  Laterality: Right;   A/V FISTULAGRAM Left 08/28/2023   Procedure: A/V Fistulagram;  Surgeon: Sheree Penne Bruckner, MD;  Location: [facility] INVASIVE CV LAB;  Service: Vascular;  Laterality: Left;   A/V FISTULAGRAM N/A 10/30/2023   Procedure: A/V Fistulagram;  Surgeon: Melia Lynwood ORN, MD;  Location: [facility] INVASIVE CV LAB;  Service: Cardiovascular;  Laterality: N/A;   A/V SHUNT INTERVENTION N/A 01/12/2024   Procedure: A/V SHUNT INTERVENTION;  Surgeon: Tobie Gordy POUR, MD;  Location: [facility] INVASIVE CV LAB;  Service: Cardiovascular;  Laterality: N/A;   A/V SHUNT INTERVENTION Right 03/18/2024   Procedure: A/V SHUNT INTERVENTION;  Surgeon: Magda Debby SAILOR, MD;  Location: [facility] PV LAB;  Service: Cardiovascular;  Laterality: Right;   ABDOMINAL HYSTERECTOMY     partial   ANKLE FUSION Right  2014?   done at [facility]   AV FISTULA PLACEMENT     AV FISTULA PLACEMENT Left 03/27/2020   Procedure: INSERTION OF LEFT UPPER ARM ARTERIOVENOUS (AV) GORE-TEX GRAFT ARM USING 4X7MM X 45 CM STRETCH GORETEX GRAFT;  Surgeon: Oris Krystal FALCON, MD;  Location: [facility] OR;  Service: Vascular;  Laterality: Left;   AV FISTULA PLACEMENT Right 04/01/2022   Procedure: RIGHT ARM ARTERIOVENOUS (AV) FISTULA  CREATION;  Surgeon: Sheree Penne Bruckner, MD;  Location: [facility] OR;  Service: Vascular;  Laterality: Right;   BASCILIC VEIN TRANSPOSITION Right 09/02/2022   Procedure: RIGHT ARM SECOND STAGE BASILIC VEIN ARTERIOVENOUS FISTULA;  Surgeon: Sheree Penne Bruckner, MD;  Location: [facility] OR;  Service: Vascular;  Laterality: Right;   BREAST LUMPECTOMY Left    COLONOSCOPY W/ POLYPECTOMY     EYE SURGERY Bilateral    Cataract   FISTULA SUPERFICIALIZATION Left 06/01/2018   Procedure: FISTULA PLICATION LEFT ARM;  Surgeon: Serene Gaile ORN, MD;  Location: [facility] OR;  Service: Vascular;  Laterality: Left;   INSERTION OF DIALYSIS CATHETER Left 03/27/2020   Procedure: INSERTION OF TUNNELED DIALYSIS CATHETER USING PALINDROME 23CM;  Surgeon: Oris Krystal FALCON, MD;  Location: [facility] OR;  Service: Vascular;  Laterality: Left;   PACEMAKER PLACEMENT  03/21/2015   [employer]   PERIPHERAL VASCULAR  BALLOON ANGIOPLASTY Right 06/27/2022   Procedure: PERIPHERAL VASCULAR BALLOON ANGIOPLASTY;  Surgeon: Sheree Penne Bruckner, MD;  Location: [facility] INVASIVE CV LAB;  Service: Cardiovascular;  Laterality: Right;   PERIPHERAL VASCULAR BALLOON ANGIOPLASTY  11/28/2022   Procedure: PERIPHERAL VASCULAR BALLOON ANGIOPLASTY;  Surgeon: Sheree Penne Bruckner, MD;  Location: [facility] INVASIVE CV LAB;  Service: Cardiovascular;;  right AVF   PERIPHERAL VASCULAR BALLOON ANGIOPLASTY  10/30/2023   Procedure: PERIPHERAL VASCULAR BALLOON ANGIOPLASTY;  Surgeon: Melia Lynwood ORN, MD;  Location: [facility] INVASIVE CV LAB;  Service: Cardiovascular;;  Innominate Vein   REVISON OF  ARTERIOVENOUS FISTULA Left 09/14/2016   Procedure: LEFT CEPHALIC TURNDOWN;  Surgeon: Redell LITTIE Door, MD;  Location: [facility] OR;  Service: Vascular;  Laterality: Left;   VENOUS ANGIOPLASTY Right 01/12/2024   Procedure: VENOUS ANGIOPLASTY;  Surgeon: Tobie Gordy POUR, MD;  Location: [facility] INVASIVE CV LAB;  Service: Cardiovascular;  Laterality: Right;  innominate and subclavian   VENOUS ANGIOPLASTY  03/18/2024   Procedure: VENOUS ANGIOPLASTY;  Surgeon: Magda Debby SAILOR, MD;  Location: [facility] PV LAB;  Service: Cardiovascular;;  IVC and innominate    Short Social History:  Social History   Tobacco Use   Smoking status: Former    Current packs/day: 0.00    Types: Cigarettes    Start date: 09/09/1966    Quit date: 09/09/1986    Years since quitting: 37.6   Smokeless tobacco: Never  Substance Use Topics   Alcohol use: No    Allergies  Allergen Reactions   Penicillins Hives    Has patient had a PCN reaction causing immediate rash, facial/tongue/throat swelling, SOB or lightheadedness with hypotension: No Has patient had a PCN reaction causing severe rash involving mucus membranes or skin necrosis: No Has patient had a PCN reaction that required hospitalization: No Has patient had a PCN reaction occurring within the last 10 years: Yes If all of the above answers are NO, then may proceed with Cephalosporin use.    Oxycodone  Nausea And Vomiting    No current facility-administered medications for this encounter.    REVIEW OF SYSTEMS All other systems were reviewed and are negative     Objective:   Objective   Vitals:   04/24/24 0743 04/24/24 0755  BP: 130/85 130/85  Pulse: (!) 102 96  Resp: 12 14  Temp: 97.9 F (36.6 C)   TempSrc: Oral   SpO2: 97% 98%   There is no height or weight on file to calculate BMI.  Physical Exam General: no acute distress Cardiac: hemodynamically stable Extremities: Palpable and right arm AV fistula  Data: Reviewed fistulogram from Dr. Magda on June  16. Subclavian vein and SVC were ballooned with a 12 mm Mustang.     Assessment/Plan:   Stacy Hammond is a 78 y.o. female with ESRD presenting for fistulogram.  Having issues with low flows. Reviewed risks and benefits of fistulogram with possible intervention and patient agreed to proceed.   Norman Serve, MD [facility]

## 2024-04-26 ENCOUNTER — Ambulatory Visit: Attending: Cardiology | Admitting: Cardiology

## 2024-04-26 ENCOUNTER — Encounter: Payer: Self-pay | Admitting: Cardiology

## 2024-04-26 VITALS — BP 111/58 | HR 105 | Ht 64.2 in | Wt 168.0 lb

## 2024-04-26 DIAGNOSIS — N186 End stage renal disease: Secondary | ICD-10-CM | POA: Diagnosis not present

## 2024-04-26 DIAGNOSIS — Z950 Presence of cardiac pacemaker: Secondary | ICD-10-CM | POA: Diagnosis not present

## 2024-04-26 DIAGNOSIS — I442 Atrioventricular block, complete: Secondary | ICD-10-CM

## 2024-04-26 DIAGNOSIS — I4891 Unspecified atrial fibrillation: Secondary | ICD-10-CM

## 2024-04-26 DIAGNOSIS — I480 Paroxysmal atrial fibrillation: Secondary | ICD-10-CM

## 2024-04-26 DIAGNOSIS — D6869 Other thrombophilia: Secondary | ICD-10-CM

## 2024-04-26 DIAGNOSIS — Z992 Dependence on renal dialysis: Secondary | ICD-10-CM

## 2024-04-26 LAB — CUP PACEART INCLINIC DEVICE CHECK
Date Time Interrogation Session: 20250725150000
Implantable Lead Connection Status: 753985
Implantable Lead Connection Status: 753985
Implantable Lead Implant Date: 20160608
Implantable Lead Implant Date: 20160608
Implantable Lead Location: 753859
Implantable Lead Location: 753860
Implantable Lead Model: 7740
Implantable Lead Model: 7741
Implantable Lead Serial Number: 621979
Implantable Lead Serial Number: 648349
Implantable Pulse Generator Implant Date: 20160608
Lead Channel Impedance Value: 651 Ohm
Lead Channel Impedance Value: 979 Ohm
Lead Channel Pacing Threshold Amplitude: 1.2 V
Lead Channel Pacing Threshold Amplitude: 3.3 V
Lead Channel Pacing Threshold Pulse Width: 0.4 ms
Lead Channel Pacing Threshold Pulse Width: 1 ms
Lead Channel Sensing Intrinsic Amplitude: 2.9 mV
Lead Channel Setting Pacing Amplitude: 1.9 V
Lead Channel Setting Pacing Amplitude: 2.5 V
Lead Channel Setting Pacing Pulse Width: 0.4 ms
Lead Channel Setting Sensing Sensitivity: 2.5 mV
Pulse Gen Serial Number: 703866
Zone Setting Status: 755011

## 2024-04-26 NOTE — Patient Instructions (Signed)
 Medication Instructions:  Your physician recommends that you continue on your current medications as directed. Please refer to the Current Medication list given to you today.  *If you need a refill on your cardiac medications before your next appointment, please call your pharmacy*  Follow-Up: At [facility], you and your health needs are our priority.  As part of our continuing mission to provide you with exceptional heart care, our providers are all part of one team.  This team includes your primary Cardiologist (physician) and Advanced Practice Providers or APPs (Physician Assistants and Nurse Practitioners) who all work together to provide you with the care you need, when you need it.  Your next appointment:   We will contact you with next steps

## 2024-04-26 NOTE — Progress Notes (Signed)
 " Electrophysiology Office Note:   Date:  04/30/2024  ID:  BRIANI MAUL, DOB 22-Oct-1945, MRN [id number]  Primary Cardiologist: None Electrophysiologist: Fonda Kitty, MD      History of Present Illness:   Stacy Hammond is a 78 y.o. female with h/o dual-chamber pacemaker, NSVT, paroxysmal atrial fibrillation, OSA, ESRD on hemodialysis who is being seen today for evaluation for pacemaker management.  Discussed the use of AI scribe software for clinical note transcription with the patient, who gave verbal consent to proceed.  History of Present Illness Stacy Hammond is a 78 year old female with a pacemaker and dialysis fistula who presents with vascular complications related to her transvenous pacing device. She was referred by her vascular surgeons for evaluation for lead extraction.  Patient initially had left upper extremity fistula.  She then developed complete heart block, necessitating right sided transvenous dual-chamber pacemaker implant.  Her left upper extremity fistula failed and she needed new access.  She was unable to get fistula placed in her lower extremity, so she had right upper extremity fistula placed.  Over time, she has had stenosis related to her pacing leads that have impaired fistula function.  She has had multiple interventions this year with ballooning.  She also has history of atrial fibrillation, which she states is high burden.  She seems to be doing relatively well otherwise.  She has no new or acute complaints today.   Review of systems complete and found to be negative unless listed in HPI.   EP Information / Studies Reviewed:    EKG is ordered today. Personal review as below.  EKG Interpretation Date/Time:  Friday April 26 2024 15:02:32 EDT Ventricular Rate:  105 PR Interval:  178 QRS Duration:  170 QT Interval:  384 QTC Calculation: 507 R Axis:   -58  Text Interpretation: Atrial-sensed ventricular-paced rhythm When compared with ECG of 27-Jun-2022  07:40, Vent. rate has increased BY  14 BPM Confirmed by Kitty Fonda [phone]) on 04/30/2024 10:54:26 PM   Echo 07/2023:  Summary   1. The left ventricle is relatively small in size with severely increased  wall thickness.    2. The left ventricular systolic function is normal, LVEF is visually  estimated at > 55%.    3. Diastolic parameters suggest elevated left atrial pressure.    4. The right ventricle is normal in size, with normal systolic function.    5. The left atrium is moderately dilated in size.    6. There is mild pulmonary hypertension.       Physical Exam:   VS:  BP (!) 111/58   Pulse (!) 105   Ht 5' 4.2 (1.631 m)   Wt 168 lb (76.2 kg)   LMP  (LMP Unknown)   SpO2 97%   BMI 28.66 kg/m    Wt Readings from Last 3 Encounters:  04/26/24 168 lb (76.2 kg)  01/12/24 176 lb (79.8 kg)  10/30/23 179 lb (81.2 kg)     GEN: Chronically ill appearing, in no acute distress NECK: No JVD CARDIAC: Normal rate, regular rhythm.  Well-healed right chest pacer pocket. RESPIRATORY:  Clear to auscultation without rales, wheezing or rhonchi  ABDOMEN: Soft, non-distended EXTREMITIES:  No edema; No deformity   ASSESSMENT AND PLAN:    Complete heart block status post pacemaker ESRD on hemodialysis: Right upper extremity fistula with stenosis secondary to pacemaker leads. -Patient unfortunately has developed a complex situation regarding pacemaker management.  She initially had left upper extremity dialysis  fistula.  Right sided dual-chamber pacemaker was implanted in setting of complete heart block.  Her left upper extremity fistula failed and she was unable to have fistula placed in her leg, so right upper extremity AV fistula was created in the setting of an indwelling right sided dual-chamber pacemaker.  She has subsequently developed stenosis related to her pacemaker leads which is interfering with her fistula function.  She has required multiple interventions on her fistula this year  with ballooning.  The patient is pacemaker dependent.  Her system is 76 years old.  Removal of her system would require cardiac surgery backup and likely a combined approach with vascular surgery.  The trauma to the wall of the vein associated with utilization of laser or mechanical cutting tool to remove the leads would likely result in near immediate complete occlusion of her axillary/subclavian vein.  This would need to be immediately intervened upon, probably with ballooning or stenting.  Long-term durability would be questionable.  Will need to discuss closely with vascular surgery whether or not this would be feasible.  Due to her pacemaker dependency, she would also need leadless pacemaker implant during the same procedure.  Risks and benefits of transvenous lead extraction, including but not limited to SVC tear, need for emergent open heart surgery or even death were discussed with the patient.  Patient is hoping to avoid extraction of her device if possible.  The only other option that I see would be tunneled dialysis catheter for dialysis access, but this would also come at increased risk of infection.  If she were to develop infection then device would certainly have to come out at that time. - Update echocardiogram.  Paroxysmal atrial fibrillation Secondary hypercoagulable state due to atrial fibrillation -Continue amiodarone 100 mg daily. Continue Eliquis  2.5 mg twice daily.  Will discuss with vascular surgery and plan follow-up with patient after that.   Total time of encounter: 80 minutes total time of encounter, including face-to-face patient care, coordination of care and counseling regarding high complexity medical decision making.  Signed, Fonda Kitty, MD  "

## 2024-04-28 ENCOUNTER — Ambulatory Visit: Payer: Self-pay | Admitting: Cardiology

## 2024-05-20 ENCOUNTER — Telehealth: Payer: Self-pay | Admitting: Vascular Surgery

## 2024-05-29 NOTE — Telephone Encounter (Signed)
 Pt surgery already scheduled

## 2024-05-31 ENCOUNTER — Encounter (HOSPITAL_COMMUNITY): Payer: Self-pay | Admitting: Vascular Surgery

## 2024-05-31 ENCOUNTER — Other Ambulatory Visit: Payer: Self-pay

## 2024-05-31 ENCOUNTER — Encounter (HOSPITAL_COMMUNITY)
Admission: RE | Disposition: A | Discharge status: Home / Self Care | Payer: Self-pay | Source: Home / Self Care | Attending: Vascular Surgery

## 2024-05-31 ENCOUNTER — Ambulatory Visit (HOSPITAL_COMMUNITY)
Admission: RE | Admit: 2024-05-31 | Discharge: 2024-05-31 | Disposition: A | Discharge status: Home / Self Care | Attending: Vascular Surgery | Admitting: Vascular Surgery

## 2024-05-31 DIAGNOSIS — I509 Heart failure, unspecified: Secondary | ICD-10-CM | POA: Diagnosis not present

## 2024-05-31 DIAGNOSIS — Z87891 Personal history of nicotine dependence: Secondary | ICD-10-CM | POA: Insufficient documentation

## 2024-05-31 DIAGNOSIS — Z992 Dependence on renal dialysis: Secondary | ICD-10-CM | POA: Diagnosis not present

## 2024-05-31 DIAGNOSIS — N186 End stage renal disease: Secondary | ICD-10-CM | POA: Diagnosis not present

## 2024-05-31 DIAGNOSIS — Z950 Presence of cardiac pacemaker: Secondary | ICD-10-CM | POA: Diagnosis not present

## 2024-05-31 DIAGNOSIS — I871 Compression of vein: Secondary | ICD-10-CM | POA: Insufficient documentation

## 2024-05-31 DIAGNOSIS — E1122 Type 2 diabetes mellitus with diabetic chronic kidney disease: Secondary | ICD-10-CM | POA: Insufficient documentation

## 2024-05-31 DIAGNOSIS — I132 Hypertensive heart and chronic kidney disease with heart failure and with stage 5 chronic kidney disease, or end stage renal disease: Secondary | ICD-10-CM | POA: Diagnosis present

## 2024-05-31 HISTORY — PX: DIALYSIS/PERMA CATHETER INSERTION: CATH118288

## 2024-05-31 SURGERY — DIALYSIS/PERMA CATHETER INSERTION
Anesthesia: LOCAL | Site: Chest

## 2024-05-31 MED ORDER — LIDOCAINE HCL (PF) 1 % IJ SOLN
INTRAMUSCULAR | Status: DC | PRN
  Administered 2024-05-31: 30 mL via INTRADERMAL

## 2024-05-31 MED ORDER — HEPARIN SODIUM (PORCINE) 1000 UNIT/ML IJ SOLN
INTRAMUSCULAR | Status: DC | PRN
  Administered 2024-05-31: 5200 [IU] via INTRAVENOUS

## 2024-05-31 MED ORDER — LIDOCAINE HCL (PF) 1 % IJ SOLN
INTRAMUSCULAR | Status: AC
  Filled 2024-05-31: qty 30

## 2024-05-31 MED ORDER — HEPARIN (PORCINE) IN NACL 1000-0.9 UT/500ML-% IV SOLN
INTRAVENOUS | Status: DC | PRN
  Administered 2024-05-31: 500 mL

## 2024-05-31 MED ORDER — HEPARIN SODIUM (PORCINE) 1000 UNIT/ML IJ SOLN
INTRAMUSCULAR | Status: AC
  Filled 2024-05-31: qty 10

## 2024-05-31 SURGICAL SUPPLY — 6 items
KIT CATH CHRNC PALINDROME 14.5 (CATHETERS) IMPLANT
KIT MICROPUNCTURE NIT STIFF (SHEATH) IMPLANT
KIT PV (KITS) ×1 IMPLANT
SHEATH PROBE COVER 6X72 (BAG) IMPLANT
TRAY PV CATH (CUSTOM PROCEDURE TRAY) ×1 IMPLANT
TUBING CIL FLEX 10 FLL-RA (TUBING) IMPLANT

## 2024-05-31 NOTE — Op Note (Signed)
    Patient name: Stacy Hammond MRN: [id number] DOB: 03-06-1946 Sex: female  05/31/2024 Pre-operative Diagnosis: End-stage renal disease with need for permanent dialysis access Post-operative diagnosis:  Same Surgeon:  Lonni DOROTHA Gaskins, MD Procedure Performed: 1.  Ultrasound-guided access right common femoral vein 2.  Placement of right common femoral vein tunneled dialysis catheter under fluoroscopic guidance (44 cm palindrome)  Indications: 78 year old female whose had multiple failed access procedures in both upper arms.  Now with a failing right arm AV fistula.  She presents for permanent dialysis access after risk benefits discussed with the request for tunneled dialysis catheter placement.  Findings:   Elected not to place the [facility] in the right IJ given her existing pacemaker.  The left IJ appeared occluded and has had multiple previous catheters.  I elected for right common femoral vein placement under fluoroscopic guidance with a 44 cm palindrome catheter tunneled with the tip near the cavoatrial junction.  Flushed and aspirated easily   Procedure:  The patient was identified in the holding area and taken to [facility] PV lab.  Placed on the table in supine position.  I elected not to place this in the right IJ given her existing pacemaker.  I evaluated the left internal jugular vein and although there was a patent vessel this appeared to occlude centrally with multiple collaterals.  Ultimately elected for femoral vein placement.  We then prepped and draped the right groin.  A timeout was performed.  I used ultrasound to access the right common femoral vein with micro access needle and placed a microwire and a micro sheath.  Then used the J-wire in the kit.  I measured a 44 cm palindrome catheter.  I then used local to anesthetize the skin in the right thigh.  I made a counterincision and then tunneled the catheter from the thigh exit site to the common femoral vein stick site.  I then dilated  over the wire and placed a large dilator peel-away sheath in the common femoral vein into the iliac vein.  I then remove the inner wire and dilator.  I then advanced the catheter through the sheath and this was peeled away and we positioned this with the tip near the cavoatrial junction.  I did pull back slightly to prevent kinking.  It flushed and aspirated easily.  It was secured with a multiple 2-0 nylon sutures.  The common femoral and stick site was closed with a 4-0 Monocryl and Dermabond.  Loaded with heparin  according to manufactures recommendations.  Taken to holding in stable condition.     Lonni DOROTHA Gaskins, MD [facility] [place] Office: [phone]

## 2024-05-31 NOTE — H&P (Signed)
 H&P      History of Present Illness: This is a 78 y.o. female with ESRD that presents for tunneled dialysis catheter placement.  Has had multiple failed access procedures in the past.  Right arm fistula no longer working.  Has a right chest pacemaker.  States she wants a catheter.  Understands from Dr. Pearline that her right arm fistula was not going to work much longer.  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   [employer]   Sleep apnea    Does not use CPAP   Stroke (HCC)    light   Thyroid disease     Past Surgical History:  Procedure Laterality Date   A/V FISTULAGRAM Right 11/28/2022   Procedure: A/V Fistulagram;  Surgeon: Sheree Penne Bruckner, MD;  Location: [facility] INVAS[facility] CV LAB;  Service: Cardiovascular;  Laterality: Right;   A/V FISTULAGRAM Left 08/28/2023   Procedure: A/V Fistulagram;  Surgeon: Sheree Penne Bruckner, MD;  Location: [facility] INVASIVE CV LAB;  Service: Vascular;  Laterality: Left;   A/V FISTULAGRAM N/A 10/30/2023   Procedure: A/V Fistulagram;  Surgeon: Melia Lynwood ORN, MD;  Location: [facility] INVASIVE CV LAB;  Service: Cardiovascular;  Laterality: N/A;   A/V SHUNT INTERVENTION N/A 01/12/2024   Procedure: A/V SHUNT INTERVENTION;  Surgeon: Tobie Gordy POUR, MD;  Location: [facility] INVASIVE CV LAB;  Service: Cardiovascular;  Laterality: N/A;   A/V SHUNT INTERVENTION Right 03/18/2024   Procedure: A/V SHUNT INTERVENTION;  Surgeon: Magda Debby SAILOR, MD;   Location: [facility];  Service: Cardiovascular;  Laterality: Right;   A/V SHUNT INTERVENTION Right 04/24/2024   Procedure: A/V SHUNT INTERVENTION;  Surgeon: Pearline Norman RAMAN, MD;  Location: [facility];  Service: Cardiovascular;  Laterality: Right;   ABDOMINAL HYSTERECTOMY     partial   ANKLE FUSION Right 2014?   done at [facility]   AV FISTULA PLACEMENT     AV FISTULA PLACEMENT Left 03/27/2020   Procedure: INSERTION OF LEFT UPPER ARM ARTERIOVENOUS (AV) GORE-TEX GRAFT ARM USING 4X7MM X 45 CM STRETCH GORETEX GRAFT;  Surgeon: Oris Krystal FALCON, MD;  [facility] OR;  Service: Vascular;  Laterality: Left;   AV FISTULA PLACEMENT Right 04/01/2022   Procedure: RIGHT ARM ARTERIOVENOUS (AV) FISTULA  CREATION;  Surgeon: Sheree Penne Bruckner, MD;  Location: [facility] OR;  Service: Vascular;  Laterality: Right;   BASCILIC VEIN TRANSPOSITION Right 09/02/2022   Procedure: RIGHT ARM SECOND STAGE BASILIC VEIN ARTERIOVENOUS FISTULA;  Surgeon: Sheree Penne Bruckner, MD;  Location: [facility] OR;  Service: Vascular;  Laterality: Right;   BREAST LUMPECTOMY Left    COLONOSCOPY W/ POLYPECTOMY     EYE SURGERY Bilateral    Cataract   FISTULA SUPERFICIALIZATION Left 06/01/2018   Procedure: FISTULA PLICATION LEFT ARM;  Surgeon: Serene Gaile ORN, MD;  [facility]: [facility] OR;  Service: Vascular;  Laterality:  Left;   INSERTION OF DIALYSIS CATHETER Left 03/27/2020   Procedure: INSERTION OF TUNNELED DIALYSIS CATHETER USING PALINDROME 23CM;  Surgeon: Oris Krystal FALCON, MD;  Location: [facility] OR;  Service: Vascular;  Laterality: Left;   PACEMAKER PLACEMENT  03/21/2015   [employer]   PERIPHERAL VASCULAR BALLOON ANGIOPLASTY Right 06/27/2022   Procedure: PERIPHERAL VASCULAR BALLOON ANGIOPLASTY;  Surgeon: Sheree Penne Bruckner, MD;  Location: [facility] INVASIVE CV LAB;  Service: Cardiovascular;  Laterality: Right;   PERIPHERAL VASCULAR BALLOON ANGIOPLASTY  11/28/2022   Procedure: PERIPHERAL VASCULAR BALLOON ANGIOPLASTY;  Surgeon: Sheree Penne Bruckner, MD;  Location: [facility] INVASIVE CV LAB;  Service: Cardiovascular;;  right AVF   PERIPHERAL VASCULAR BALLOON ANGIOPLASTY  10/30/2023   Procedure: PERIPHERAL VASCULAR BALLOON ANGIOPLASTY;  Surgeon: Melia Lynwood ORN, MD;  [facility] INVAS[facility] CV LAB;  Service: Cardiovascular;;  Innominate Vein   REVISON OF ARTERIOVENOUS FISTULA Left 09/14/2016   Procedure: LEFT CEPHALIC TURNDOWN;  Surgeon: Redell LITTIE Door, MD;  Location: [facility] OR;  Service: Vascular;  Laterality: Left;   VENOUS ANGIOPLASTY Right 01/12/2024   Procedure: VENOUS ANGIOPLASTY;  Surgeon: Tobie Gordy POUR, MD;  Location: [facility] INVASIVE CV LAB;  Service: Cardiovascular;  Laterality: Right;  innominate and subclavian   VENOUS ANGIOPLASTY  03/18/2024   Procedure: VENOUS ANGIOPLASTY;  Surgeon: Magda Debby SAILOR, MD;  Location: [facility];  Service: Cardiovascular;;  IVC and innominate   VENOUS ANGIOPLASTY  04/24/2024   Procedure: VENOUS ANGIOPLASTY;  Surgeon: Pearline Norman RAMAN, MD;  Location: [facility];  Service: Cardiovascular;;  central    Allergies  Allergen Reactions   Penicillins Hives    Has patient had a PCN reaction causing immediate rash, facial/tongue/throat swelling, SOB or lightheadedness with hypotension: No Has patient had a PCN reaction causing severe rash involving mucus membranes or skin necrosis: No Has patient had a PCN reaction that required hospitalization: No Has patient had a PCN reaction occurring within the last 10 years: Yes If all of the above answers are NO, then may proceed with Cephalosporin use.    Oxycodone  Nausea And Vomiting    Prior to Admission medications   Medication Sig Start Date End Date Taking? Authorizing Provider  acetaminophen  (TYLENOL ) 500 MG tablet Take 1,000 mg by mouth every 6 (six) hours as needed for headache or moderate pain.    [provider]  amiodarone (PACERONE) 100 MG tablet Take 100 mg by mouth daily.    [provider]  apixaban  (ELIQUIS ) 2.5 MG TABS tablet Take 2.5 mg  by mouth 2 (two) times daily. 02/17/22   [provider]  atorvastatin (LIPITOR) 40 MG tablet Take 40 mg by mouth daily.    [provider]  b complex-vitamin c-folic acid (NEPHRO-VITE) 0.8 MG TABS tablet Take 1 tablet by mouth at bedtime.    [provider]  doxycycline  (VIBRAMYCIN ) 50 MG capsule Take 1 capsule (50 mg total) by mouth 2 (two) times daily. Patient not taking: Reported on 11/23/2022 09/12/22   Elna Loud P, PA-C  Ketotifen Fumarate (ALLERGY EYE DROPS OP) Place 1 drop into both eyes daily as needed (allergies).    [provider]  Menthol, Topical Analgesic, (BIOFREEZE EX) Apply 1 Application topically daily as needed (pain).    [provider]  midodrine (PROAMATINE) 5 MG tablet Take 10 mg by mouth See admin instructions. Take 10 mg by twice daily on Tuesday, Thursday and Saturday 05/01/18   [provider]  SENSIPAR 30 MG tablet Take 30 mg by mouth 3 (  three) times a week. 08/02/22   [provider]  sucroferric oxyhydroxide (VELPHORO) 500 MG chewable tablet Chew 1,000 mg by mouth 3 (three) times daily with meals.     [provider]  traMADol  (ULTRAM ) 50 MG tablet Take 1 tablet (50 mg total) by mouth every 6 (six) hours as needed. 09/02/22   Rhyne, Samantha J, PA-C  VITAMIN D PO Take by mouth daily.    [provider]    Social History   Socioeconomic History   Marital status: Single    Spouse name: Not on file   Number of children: Not on file   Years of education: Not on file   Highest education level: Not on file  Occupational History   Not on file  Tobacco Use   Smoking status: Former    Current packs/day: 0.00    Types: Cigarettes    Start date: 09/09/1966    Quit date: 09/09/1986    Years since quitting: 37.7   Smokeless tobacco: Never  Vaping Use   Vaping status: Never Used  Substance and Sexual Activity   Alcohol use: No   Drug use: No   Sexual activity: Not Currently    Birth  control/protection: Surgical    Comment: Hysterectomy  Other Topics Concern   Not on file  Social History Narrative   Not on file   Social Drivers of Health   Financial Resource Strain: Low Risk  (04/20/2022)   Received from [facility]   Overall Financial Resource Strain (CARDIA)    Difficulty of Paying Living Expenses: Not hard at all  Food Insecurity: No Food Insecurity (06/16/2023)   Received from [facility]   Hunger Vital Sign    Within the past 12 months, you worried that your food would run out before you got the money to buy more.: Never true    Within the past 12 months, the food you bought just didn't last and you didn't have money to get more.: Never true  Transportation Needs: No Transportation Needs (06/16/2023)   Received from [facility]   PRAPARE - Transportation    Lack of Transportation (Medical): No    Lack of Transportation (Non-Medical): No  Physical Activity: Inactive (04/20/2022)   Received from [facility]   Exercise Vital Sign    On average, how many days per week do you engage in moderate to strenuous exercise (like a brisk walk)?: 0 days    On average, how many minutes do you engage in exercise at this level?: 0 min  Stress: No Stress Concern Present (04/20/2022)   Received from [facility] - Occupational Stress Questionnaire    Feeling of Stress : Not at all  Social Connections: Moderately Integrated (04/20/2022)   Received from [facility]   Social Connection and Isolation Panel    In a typical week, how many times do you talk on the phone with family, friends, or neighbors?: More than three times a week    How often do you get together with friends or relatives?: More than three times a week    How often do you attend church or religious services?: More than 4 times per year    Do you belong to any clubs or organizations such as church groups, unions, fraternal or athletic groups, or  school groups?: No    How often do you attend meetings of the clubs or organizations you belong to?:  More than 4 times per year    Are you married, widowed, divorced, separated, never married, or living with a partner?: Never married  Intimate Partner Violence: Not At Risk (04/20/2022)   Received from [facility]   Humiliation, Afraid, Rape, and Kick questionnaire    Within the last year, have you been afraid of your partner or ex-partner?: No    Within the last year, have you been humiliated or emotionally abused in other ways by your partner or ex-partner?: No    Within the last year, have you been kicked, hit, slapped, or otherwise physically hurt by your partner or ex-partner?: No    Within the last year, have you been raped or forced to have any kind of sexual activity by your partner or ex-partner?: No     No family history on file.  ROS: [x]  Positive   [ ]  Negative   [ ]  All sytems reviewed and are negative  Cardiovascular: []  chest pain/pressure []  palpitations []  SOB lying flat []  DOE []  pain in legs while walking []  pain in legs at rest []  pain in legs at night []  non-healing ulcers []  hx of DVT []  swelling in legs  Pulmonary: []  productive cough []  asthma/wheezing []  home O2  Neurologic: []  weakness in []  arms []  legs []  numbness in []  arms []  legs []  hx of CVA []  mini stroke [] difficulty speaking or slurred speech []  temporary loss of vision in one eye []  dizziness  Hematologic: []  hx of cancer []  bleeding problems []  problems with blood clotting easily  Endocrine:   []  diabetes []  thyroid disease  GI []  vomiting blood []  blood in stool  GU: []  CKD/renal failure []  HD--[]  M/W/F or []  T/T/S []  burning with urination []  blood in urine  Psychiatric: []  anxiety []  depression  Musculoskeletal: []  arthritis []  joint pain  Integumentary: []  rashes []  ulcers  Constitutional: []  fever []  chills   Physical Examination  There were no  vitals filed for this visit. There is no height or weight on file to calculate BMI.  General:  WDWN in NAD Gait: Not observed HENT: WNL, normocephalic Pulmonary: normal non-labored breathing Cardiac: regular, without  Murmurs, rubs or gallops Abdomen:  soft, NT/ND, no masses Vascular Exam/Pulses: Right arm fistula with no appreciable thrill Right chest pacemaker Musculoskeletal: no muscle wasting or atrophy  Neurologic: A&O X 3; Appropriate Affect ; SENSATION: normal; MOTOR FUNCTION:  moving all extremities equally. Speech is fluent/normal   CBC    Component Value Date/Time   HGB 15.6 (H) 11/28/2022 1347   HCT 46.0 11/28/2022 1347    BMET    Component Value Date/Time   NA 139 11/28/2022 1347   K 5.0 11/28/2022 1347   CL 100 11/28/2022 1347   CO2 26 09/02/2022 1333   GLUCOSE 87 11/28/2022 1347   BUN 55 (H) 11/28/2022 1347   CREATININE 12.00 (H) 11/28/2022 1347   CALCIUM 9.7 09/02/2022 1333   GFRNONAA 4 (L) 09/02/2022 1333    COAGS: Lab Results  Component Value Date   INR 1.0 03/27/2020   INR 0.98 06/01/2018   INR 1.07 09/14/2016     Non-Invasive Vascular Imaging:      ASSESSMENT/PLAN: This is a 78 y.o. female with ESRD that presents for tunneled dialysis catheter placement.  Has had multiple failed access procedures in the past.  Right arm fistula no longer working.  Has a right chest pacemaker.  States she wants a catheter.  Understands from Dr. Pearline  that her fistula was not going to work much longer.  Discussed plan for tunneled dialysis catheter placement.  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  Discussed if unsuccessful placing this in the neck will have to go to the groin.  All questions answered.  Lonni DOROTHA Gaskins, MD [facility] Office: [phone]  Lonni JINNY Gaskins

## 2024-06-07 ENCOUNTER — Telehealth: Payer: Self-pay

## 2024-06-07 NOTE — Telephone Encounter (Signed)
 Outreach made to daughter.  Call went to VM-mailbox full.  Pt needs BS software upload.  She has an upcoming appointment 07/05/2024 between 1-2:40 pm with vascular.  PLEASE SCHEDULE PT APPT WITH DEVICE CLINIC FOR DEVICE INTERROGATION FOR BS SOFTWARE UPLOAD before or after appointment with vascular.

## 2024-06-11 NOTE — Telephone Encounter (Signed)
 Device Clinic apt made 07/05/24 @ 12:30.

## 2024-06-18 ENCOUNTER — Other Ambulatory Visit: Payer: Self-pay | Admitting: *Deleted

## 2024-06-18 DIAGNOSIS — N186 End stage renal disease: Secondary | ICD-10-CM

## 2024-07-02 ENCOUNTER — Ambulatory Visit: Admitting: Vascular Surgery

## 2024-07-04 NOTE — Progress Notes (Deleted)
 Patient ID: Stacy Hammond, female   DOB: June 07, 1946, 79 y.o.   MRN: [id number]  Reason for Consult: No chief complaint on file.   Referred by Jude Devere HERO, MD  Subjective:     HPI Stacy Hammond is a 78 y.o. female who presents for evaluation HD access creation.  Past Medical History: No date: Anemia No date: Arthritis No date: Cancer [facility])     Comment:  Left Breast No date: CHF (congestive heart failure) (HCC) No date: Chronic kidney disease     Comment:  Tues, Thurs, Sat No date: Diabetes mellitus without complication (HCC)     Comment:  type 2 - no meds diet controlled No date: DVT (deep venous thrombosis) (HCC) No date: Dyspnea     Comment:  with exertion No date: Dysrhythmia     Comment:  NSVT, PAF No date: Hyperlipidemia No date: Hypertension No date: Hypotension     Comment:  associated with hemodialysis No date: Neuromuscular disorder (HCC) No date: Peripheral arterial disease 03/11/2015: 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 Past Surgical History:  Procedure Laterality Date   A/V FISTULAGRAM Right 11/28/2022   Procedure: A/V Fistulagram;  Surgeon: Sheree Penne Bruckner, MD;  Location: [facility] INVASIVE CV LAB;  Service: Cardiovascular;  Laterality: Right;   A/V FISTULAGRAM Left 08/28/2023   Procedure: A/V Fistulagram;  Surgeon: Sheree Penne Bruckner, MD;  Location: [facility] INVASIVE CV LAB;  Service: Vascular;  Laterality: Left;   A/V FISTULAGRAM N/A 10/30/2023   Procedure: A/V Fistulagram;  Surgeon: Melia Lynwood ORN, MD;  Location: [facility] INVASIVE CV LAB;  Service: Cardiovascular;  Laterality: N/A;   A/V SHUNT INTERVENTION N/A 01/12/2024   Procedure: A/V SHUNT INTERVENTION;  Surgeon: Tobie Gordy POUR, MD;  Location: [facility] INVASIVE CV LAB;  Service: Cardiovascular;  Laterality: N/A;   A/V  SHUNT INTERVENTION Right 03/18/2024   Procedure: A/V SHUNT INTERVENTION;  Surgeon: Magda Debby SAILOR, MD;  Location: [facility];  Service: Cardiovascular;  Laterality: Right;   A/V SHUNT INTERVENTION Right 04/24/2024   Procedure: A/V SHUNT INTERVENTION;  Surgeon: Pearline Norman RAMAN, MD;  Location: [facility];  Service: Cardiovascular;  Laterality: Right;   ABDOMINAL HYSTERECTOMY     partial   ANKLE FUSION Right 2014?   done at [facility]   AV FISTULA PLACEMENT     AV FISTULA PLACEMENT Left 03/27/2020   Procedure: INSERTION OF LEFT UPPER ARM ARTERIOVENOUS (AV) GORE-TEX GRAFT ARM USING 4X7MM X 45 CM STRETCH GORETEX GRAFT;  Surgeon: Oris Krystal FALCON, MD;  Location: [facility] OR;  Service: Vascular;  Laterality: Left;   AV FISTULA PLACEMENT Right 04/01/2022   Procedure: RIGHT ARM ARTERIOVENOUS (AV) FISTULA  CREATION;  Surgeon: Sheree Penne Bruckner, MD;  Location: [facility] OR;  Service: Vascular;  Laterality: Right;   BASCILIC VEIN TRANSPOSITION Right 09/02/2022   Procedure: RIGHT ARM SECOND STAGE BASILIC VEIN ARTERIOVENOUS FISTULA;  Surgeon: Sheree Penne Bruckner, MD;  Location: [facility] OR;  Service: Vascular;  Laterality: Right;   BREAST LUMPECTOMY Left    COLONOSCOPY W/ POLYPECTOMY     DIALYSIS/PERMA CATHETER INSERTION N/A 05/31/2024   Procedure: DIALYSIS/PERMA CATHETER INSERTION;  Surgeon: Gretta Bruckner JINNY, MD;  Location: [facility];  Service: Cardiovascular;  Laterality: N/A;   EYE SURGERY Bilateral  Cataract   FISTULA SUPERFICIALIZATION Left 06/01/2018   Procedure: FISTULA PLICATION LEFT ARM;  Surgeon: Serene Gaile ORN, MD;  Location: [facility] OR;  Service: Vascular;  Laterality: Left;   INSERTION OF DIALYSIS CATHETER Left 03/27/2020   Procedure: INSERTION OF TUNNELED DIALYSIS CATHETER USING PALINDROME 23CM;  Surgeon: Oris Krystal FALCON, MD;  Location: [facility] OR;  Service: Vascular;  Laterality: Left;   PACEMAKER PLACEMENT  03/21/2015   [employer]   PERIPHERAL VASCULAR BALLOON ANGIOPLASTY Right 06/27/2022    Procedure: PERIPHERAL VASCULAR BALLOON ANGIOPLASTY;  Surgeon: Sheree Penne Bruckner, MD;  Location: [facility] INVASIVE CV LAB;  Service: Cardiovascular;  Laterality: Right;   PERIPHERAL VASCULAR BALLOON ANGIOPLASTY  11/28/2022   Procedure: PERIPHERAL VASCULAR BALLOON ANGIOPLASTY;  Surgeon: Sheree Penne Bruckner, MD;  Location: [facility] INVASIVE CV LAB;  Service: Cardiovascular;;  right AVF   PERIPHERAL VASCULAR BALLOON ANGIOPLASTY  10/30/2023   Procedure: PERIPHERAL VASCULAR BALLOON ANGIOPLASTY;  Surgeon: Melia Lynwood ORN, MD;  [facility]: [facility] INVASIVE CV LAB;  Service: Cardiovascular;;  Innominate Vein   REVISON OF ARTERIOVENOUS FISTULA Left 09/14/2016   Procedure: LEFT CEPHALIC TURNDOWN;  Surgeon: Redell LITTIE Door, MD;  Location: [facility] OR;  Service: Vascular;  Laterality: Left;   VENOUS ANGIOPLASTY Right 01/12/2024   Procedure: VENOUS ANGIOPLASTY;  Surgeon: Tobie Gordy POUR, MD;  Location: [facility] INVASIVE CV LAB;  Service: Cardiovascular;  Laterality: Right;  innominate and subclavian   VENOUS ANGIOPLASTY  03/18/2024   Procedure: VENOUS ANGIOPLASTY;  Surgeon: Magda Debby SAILOR, MD;  Location: [facility];  Service: Cardiovascular;;  IVC and innominate   VENOUS ANGIOPLASTY  04/24/2024   Procedure: VENOUS ANGIOPLASTY;  Surgeon: Pearline Norman RAMAN, MD;  Location: [facility];  Service: Cardiovascular;;  central    Short Social History:  Social History   Tobacco Use   Smoking status: Former    Current packs/day: 0.00    Types: Cigarettes    Start date: 09/09/1966    Quit date: 09/09/1986    Years since quitting: 37.8   Smokeless tobacco: Never  Substance Use Topics   Alcohol use: No    Allergies  Allergen Reactions   Penicillins Hives    Has patient had a PCN reaction causing immediate rash, facial/tongue/throat swelling, SOB or lightheadedness with hypotension: No Has patient had a PCN reaction causing severe rash involving mucus membranes or skin necrosis: No Has patient had a PCN reaction that required hospitalization:  No Has patient had a PCN reaction occurring within the last 10 years: Yes If all of the above answers are NO, then may proceed with Cephalosporin use.    Oxycodone  Nausea And Vomiting    Current Outpatient Medications  Medication Sig Dispense Refill   acetaminophen  (TYLENOL ) 500 MG tablet Take 1,000 mg by mouth every 6 (six) hours as needed for headache or moderate pain.     amiodarone (PACERONE) 100 MG tablet Take 100 mg by mouth daily.     apixaban  (ELIQUIS ) 2.5 MG TABS tablet Take 2.5 mg by mouth 2 (two) times daily.     atorvastatin (LIPITOR) 40 MG tablet Take 40 mg by mouth daily.     b complex-vitamin c-folic acid (NEPHRO-VITE) 0.8 MG TABS tablet Take 1 tablet by mouth at bedtime.     doxycycline  (VIBRAMYCIN ) 50 MG capsule Take 1 capsule (50 mg total) by mouth 2 (two) times daily. (Patient not taking: Reported on 11/23/2022) 14 capsule 0   Ketotifen Fumarate (ALLERGY EYE DROPS OP) Place 1 drop into both eyes daily as needed (allergies).  Menthol, Topical Analgesic, (BIOFREEZE EX) Apply 1 Application topically daily as needed (pain).     midodrine (PROAMATINE) 5 MG tablet Take 10 mg by mouth See admin instructions. Take 10 mg by twice daily on Tuesday, Thursday and Saturday  2   SENSIPAR 30 MG tablet Take 30 mg by mouth 3 (three) times a week.     sucroferric oxyhydroxide (VELPHORO) 500 MG chewable tablet Chew 1,000 mg by mouth 3 (three) times daily with meals.      traMADol  (ULTRAM ) 50 MG tablet Take 1 tablet (50 mg total) by mouth every 6 (six) hours as needed. 20 tablet 0   VITAMIN D PO Take by mouth daily.     No current facility-administered medications for this visit.    REVIEW OF SYSTEMS All other systems were reviewed and are negative    Objective:  Objective   There were no vitals filed for this visit. There is no height or weight on file to calculate BMI.  Physical Exam General: no acute distress Cardiac: hemodynamically stable Pulm: normal work of  breathing Neuro: alert, no focal deficit Extremities: *** Vascular:   Right: palpable brachial, radial  Left: palpable brachial, radial   Data: UE arterial duplex ***   Vein mapping ***  Note from nephrologist reviewed ***      Assessment/Plan:     Stacy Hammond is a 78 y.o. female with {CKDACcess:31998} who presents to discuss permanent access creation.  Dominant hand: {RIGHT/LEFT:20294} Previous access surgeries: *** Previous catheters: {RIGHT/LEFT:21944} IJ Currently dialyzing via *** on *** Other arm surgeries/injuries: ***  The vein mapping suggests there is *** a suitable *** and we discussed that they are a candidate for ***  The risks an benefits including of access creation were reviewed including: need for additional procedures, need for additional creations, steal, ischemia monomelic neuropathy, failure of access, and bleeding. The patient expressed understand and is willing to proceed.  I explained that I will perform intraoperative vein mapping to confirm the above findings and will determine the most appropriate access to create but with a preoperative plan for {RIGHT/LEFT:20294} arm ***.     Norman GORMAN Serve MD [facility]

## 2024-07-05 ENCOUNTER — Ambulatory Visit (HOSPITAL_BASED_OUTPATIENT_CLINIC_OR_DEPARTMENT_OTHER)
Admission: RE | Admit: 2024-07-05 | Discharge: 2024-07-05 | Disposition: A | Discharge status: Home / Self Care | Source: Ambulatory Visit | Attending: Vascular Surgery | Admitting: Vascular Surgery

## 2024-07-05 ENCOUNTER — Ambulatory Visit

## 2024-07-05 ENCOUNTER — Ambulatory Visit: Admitting: Vascular Surgery

## 2024-07-05 ENCOUNTER — Ambulatory Visit
Admission: RE | Admit: 2024-07-05 | Discharge: 2024-07-05 | Disposition: A | Discharge status: Home / Self Care | Source: Ambulatory Visit | Attending: Vascular Surgery | Admitting: Vascular Surgery

## 2024-07-05 DIAGNOSIS — N186 End stage renal disease: Secondary | ICD-10-CM

## 2024-07-05 DIAGNOSIS — I442 Atrioventricular block, complete: Secondary | ICD-10-CM | POA: Diagnosis present

## 2024-07-05 LAB — CUP PACEART INCLINIC DEVICE CHECK
Date Time Interrogation Session: 20251003124157
Implantable Lead Connection Status: 753985
Implantable Lead Connection Status: 753985
Implantable Lead Implant Date: 20160608
Implantable Lead Implant Date: 20160608
Implantable Lead Location: 753859
Implantable Lead Location: 753860
Implantable Lead Model: 7740
Implantable Lead Model: 7741
Implantable Lead Serial Number: 621979
Implantable Lead Serial Number: 648349
Implantable Pulse Generator Implant Date: 20160608
Pulse Gen Serial Number: 703866

## 2024-07-05 NOTE — Progress Notes (Signed)
 BS software upgrade complete

## 2024-07-05 NOTE — Patient Instructions (Signed)
Follow up per recall. 

## 2024-07-17 NOTE — Telephone Encounter (Signed)
 Please contact patient to set up follow up appointment.  Thanks!

## 2024-07-23 NOTE — Progress Notes (Unsigned)
 Patient ID: Stacy Hammond, female   DOB: Jul 26, 1946, 78 y.o.   MRN: [id number]  Reason for Consult: No chief complaint on file.   Referred by Jude Devere HERO, MD  Subjective:     HPI [name] is a 78 y.o. female who presents for evaluation HD access creation.  Past Medical History: No date: Anemia No date: Arthritis No date: Cancer [employer])     Comment:  Left Breast No date: CHF (congestive heart failure) (HCC) No date: Chronic kidney disease     Comment:  Tues, Thurs, Sat No date: Diabetes mellitus without complication (HCC)     Comment:  type 2 - no meds diet controlled No date: DVT (deep venous thrombosis) (HCC) No date: Dyspnea     Comment:  with exertion No date: Dysrhythmia     Comment:  NSVT, PAF No date: Hyperlipidemia No date: Hypertension No date: Hypotension     Comment:  associated with hemodialysis No date: Neuromuscular disorder (HCC) No date: Peripheral arterial disease 03/11/2015: Presence of permanent cardiac pacemaker     Comment:  Environmental manager No date: Sleep apnea     Comment:  Does not use CPAP No date: Stroke [facility])     Comment:  light No date: Thyroid disease  No family history on file. Past Surgical History:  Procedure Laterality Date   A/V FISTULAGRAM Right 11/28/2022   Procedure: A/V Fistulagram;  Surgeon: Sheree Penne Bruckner, MD;  Location: [facility] INVASIVE CV LAB;  Service: Cardiovascular;  Laterality: Right;   A/V FISTULAGRAM Left 08/28/2023   Procedure: A/V Fistulagram;  Surgeon: Sheree Penne Bruckner, MD;  Location: [facility] INVASIVE CV LAB;  Service: Vascular;  Laterality: Left;   A/V FISTULAGRAM N/A 10/30/2023   Procedure: A/V Fistulagram;  Surgeon: Melia Lynwood ORN, MD;  Location: [facility] INVASIVE CV LAB;  Service: Cardiovascular;  Laterality: N/A;   A/V SHUNT INTERVENTION N/A 01/12/2024   Procedure: A/V SHUNT INTERVENTION;  Surgeon: Tobie Gordy POUR, MD;  Location: [facility] INVAS[facility] CV LAB;  Service: Cardiovascular;  Laterality: N/A;   A/V  SHUNT INTERVENTION Right 03/18/2024   Procedure: A/V SHUNT INTERVENTION;  Surgeon: Magda Debby SAILOR, MD;  Location: [facility] PV LAB;  Service: Cardiovascular;  Laterality: Right;   A/V SHUNT INTERVENTION Right 04/24/2024   Procedure: A/V SHUNT INTERVENTION;  Surgeon: Pearline Norman RAMAN, MD;  Location: [facility] PV LAB;  Service: Cardiovascular;  Laterality: Right;   ABDOMINAL HYSTERECTOMY     partial   ANKLE FUSION Right 2014?   done at [facility]   AV FISTULA PLACEMENT     AV FISTULA PLACEMENT Left 03/27/2020   Procedure: INSERTION OF LEFT UPPER ARM ARTERIOVENOUS (AV) GORE-TEX GRAFT ARM USING 4X7MM X 45 CM STRETCH GORETEX GRAFT;  Surgeon: Oris Krystal FALCON, MD;  Location: [facility] OR;  Service: Vascular;  Laterality: Left;   AV FISTULA PLACEMENT Right 04/01/2022   Procedure: RIGHT ARM ARTERIOVENOUS (AV) FISTULA  CREATION;  Surgeon: Sheree Penne Bruckner, MD;  Location: [facility] OR;  Service: Vascular;  Laterality: Right;   BASCILIC VEIN TRANSPOSITION Right 09/02/2022   Procedure: RIGHT ARM SECOND STAGE BASILIC VEIN ARTERIOVENOUS FISTULA;  Surgeon: Sheree Penne Bruckner, MD;  Location: [facility] OR;  Service: Vascular;  Laterality: Right;   BREAST LUMPECTOMY Left    COLONOSCOPY W/ POLYPECTOMY     DIALYSIS/PERMA CATHETER INSERTION N/A 05/31/2024   Procedure: DIALYSIS/PERMA CATHETER INSERTION;  Surgeon: Gretta Bruckner JINNY, MD;  Location: [facility] LAB;  Service: Cardiovascular;  Laterality: N/A;   EYE SURGERY Bilateral  Cataract   FISTULA SUPERFICIALIZATION Left 06/01/2018   Procedure: FISTULA PLICATION LEFT ARM;  Surgeon: Serene Gaile ORN, MD;  Location: [facility] OR;  Service: Vascular;  Laterality: Left;   INSERTION OF DIALYSIS CATHETER Left 03/27/2020   Procedure: INSERTION OF TUNNELED DIALYSIS CATHETER USING PALINDROME 23CM;  Surgeon: Oris Krystal FALCON, MD;  Location: [facility] OR;  Service: Vascular;  Laterality: Left;   PACEMAKER PLACEMENT  03/21/2015   [facility]   PERIPHERAL VASCULAR BALLOON ANGIOPLASTY Right 06/27/2022    Procedure: PERIPHERAL VASCULAR BALLOON ANGIOPLASTY;  Surgeon: Sheree Penne Bruckner, MD;  Location: [facility] INVASIVE CV LAB;  Service: Cardiovascular;  Laterality: Right;   PERIPHERAL VASCULAR BALLOON ANGIOPLASTY  11/28/2022   Procedure: PERIPHERAL VASCULAR BALLOON ANGIOPLASTY;  Surgeon: Sheree Penne Bruckner, MD;  Location: [facility] INVASIVE CV LAB;  Service: Cardiovascular;;  right AVF   PERIPHERAL VASCULAR BALLOON ANGIOPLASTY  10/30/2023   Procedure: PERIPHERAL VASCULAR BALLOON ANGIOPLASTY;  Surgeon: Melia Lynwood ORN, MD;  Location: [facility] INVASIVE CV LAB;  Service: Cardiovascular;;  Innominate Vein   REVISON OF ARTERIOVENOUS FISTULA Left 09/14/2016   Procedure: LEFT CEPHALIC TURNDOWN;  Surgeon: Redell LITTIE Door, MD;  Location: [facility] OR;  Service: Vascular;  Laterality: Left;   VENOUS ANGIOPLASTY Right 01/12/2024   Procedure: VENOUS ANGIOPLASTY;  Surgeon: Tobie Gordy POUR, MD;  Location: [facility] INVASIVE CV LAB;  Service: Cardiovascular;  Laterality: Right;  innominate and subclavian   VENOUS ANGIOPLASTY  03/18/2024   Procedure: VENOUS ANGIOPLASTY;  Surgeon: Magda Debby SAILOR, MD;  Location: [facility];  Service: Cardiovascular;;  IVC and innominate   VENOUS ANGIOPLASTY  04/24/2024   Procedure: VENOUS ANGIOPLASTY;  Surgeon: Pearline Norman RAMAN, MD;  Location: [facility];  Service: Cardiovascular;;  central    Short Social History:  Social History   Tobacco Use   Smoking status: Former    Current packs/day: 0.00    Types: Cigarettes    Start date: 09/09/1966    Quit date: 09/09/1986    Years since quitting: 37.8   Smokeless tobacco: Never  Substance Use Topics   Alcohol use: No    Allergies  Allergen Reactions   Penicillins Hives    Has patient had a PCN reaction causing immediate rash, facial/tongue/throat swelling, SOB or lightheadedness with hypotension: No Has patient had a PCN reaction causing severe rash involving mucus membranes or skin necrosis: No Has patient had a PCN reaction that required hospitalization:  No Has patient had a PCN reaction occurring within the last 10 years: Yes If all of the above answers are NO, then may proceed with Cephalosporin use.    Oxycodone  Nausea And Vomiting    Current Outpatient Medications  Medication Sig Dispense Refill   acetaminophen  (TYLENOL ) 500 MG tablet Take 1,000 mg by mouth every 6 (six) hours as needed for headache or moderate pain.     amiodarone (PACERONE) 100 MG tablet Take 100 mg by mouth daily.     apixaban  (ELIQUIS ) 2.5 MG TABS tablet Take 2.5 mg by mouth 2 (two) times daily.     atorvastatin (LIPITOR) 40 MG tablet Take 40 mg by mouth daily.     b complex-vitamin c-folic acid (NEPHRO-VITE) 0.8 MG TABS tablet Take 1 tablet by mouth at bedtime.     doxycycline  (VIBRAMYCIN ) 50 MG capsule Take 1 capsule (50 mg total) by mouth 2 (two) times daily. (Patient not taking: Reported on 11/23/2022) 14 capsule 0   Ketotifen Fumarate (ALLERGY EYE DROPS OP) Place 1 drop into both eyes daily as needed (allergies).  Menthol, Topical Analgesic, (BIOFREEZE EX) Apply 1 Application topically daily as needed (pain).     midodrine (PROAMATINE) 5 MG tablet Take 10 mg by mouth See admin instructions. Take 10 mg by twice daily on Tuesday, Thursday and Saturday  2   SENSIPAR 30 MG tablet Take 30 mg by mouth 3 (three) times a week.     sucroferric oxyhydroxide (VELPHORO) 500 MG chewable tablet Chew 1,000 mg by mouth 3 (three) times daily with meals.      traMADol  (ULTRAM ) 50 MG tablet Take 1 tablet (50 mg total) by mouth every 6 (six) hours as needed. 20 tablet 0   VITAMIN D PO Take by mouth daily.     No current facility-administered medications for this visit.    REVIEW OF SYSTEMS All other systems were reviewed and are negative    Objective:  Objective   There were no vitals filed for this visit. There is no height or weight on file to calculate BMI.  Physical Exam General: no acute distress Neuro: alert, no focal deficit Extremities: Multiple scars from  previous access surgeries on bilateral arms.   Data: UE arterial duplex Right Pre-Dialysis Findings:  +-----------------------+----------+--------------------+---------+--------  +  Location              PSV (cm/s)Intralum. Diam. (cm)Waveform  Comments  +-----------------------+----------+--------------------+---------+--------  +  Brachial Antecub. fossa43        0.40                triphasic           +-----------------------+----------+--------------------+---------+--------  +  Radial Art at Wrist    51        0.16                biphasic            +-----------------------+----------+--------------------+---------+--------  +  Ulnar Art at Wrist     53        0.13                biphasic            +-----------------------+----------+--------------------+---------+--------  +   Left Pre-Dialysis Findings:  +----------------------+----------+-------------------+--------+-----------  ----+  Location             PSV (cm/s)Intralum. Diam.    WaveformComments                                          (cm)                                         +----------------------+----------+-------------------+--------+-----------  ----+  Brachial Antecub.     26        0.40               biphasic                  fossa                                                                        +----------------------+----------+-------------------+--------+-----------  ----+  Radial Art at Wrist   33        0.13               biphasic                  +----------------------+----------+-------------------+--------+-----------  ----+  Ulnar Art at Wrist                                         No flow at                                                                   wrist             +----------------------+----------+-------------------+--------+-----------  ----+    Vein  mapping +-----------------+-------------+----------+--------------+  Right Cephalic   Diameter (cm)Depth (cm)   Findings     +-----------------+-------------+----------+--------------+  Shoulder                               not visualized  +-----------------+-------------+----------+--------------+  Prox upper arm                          not visualized  +-----------------+-------------+----------+--------------+  Mid upper arm                           not visualized  +-----------------+-------------+----------+--------------+  Dist upper arm                          not visualized  +-----------------+-------------+----------+--------------+  Antecubital fossa                       not visualized  +-----------------+-------------+----------+--------------+  Prox forearm         0.19        0.54                   +-----------------+-------------+----------+--------------+  Mid forearm          0.16        0.41                   +-----------------+-------------+----------+--------------+  Wrist               0.16        0.26                   +-----------------+-------------+----------+--------------+   +-----------------+-------------+----------+--------------+  Right Basilic    Diameter (cm)Depth (cm)   Findings     +-----------------+-------------+----------+--------------+  Mid upper arm                           not visualized  +-----------------+-------------+----------+--------------+  Dist upper arm       0.36        1.50                   +-----------------+-------------+----------+--------------+  Antecubital fossa  not visualized  +-----------------+-------------+----------+--------------+  Prox forearm                            not visualized  +-----------------+-------------+----------+--------------+  Mid forearm                             not visualized   +-----------------+-------------+----------+--------------+  Wrist                                  not visualized  +-----------------+-------------+----------+--------------+   +-----------------+-------------+----------+----------+  Left Cephalic    Diameter (cm)Depth (cm) Findings   +-----------------+-------------+----------+----------+  Shoulder            0.32        0.40               +-----------------+-------------+----------+----------+  Prox upper arm       0.41        0.19               +-----------------+-------------+----------+----------+  Mid upper arm                           thrombosed  +-----------------+-------------+----------+----------+  Dist upper arm       0.10        0.42               +-----------------+-------------+----------+----------+  Antecubital fossa    0.12        0.34               +-----------------+-------------+----------+----------+  Prox forearm         0.13        0.50               +-----------------+-------------+----------+----------+  Mid forearm          0.27        0.25               +-----------------+-------------+----------+----------+  Wrist               0.14        0.26               +-----------------+-------------+----------+----------+   +-----------------+-------------+----------+---------+  Left Basilic     Diameter (cm)Depth (cm)Findings   +-----------------+-------------+----------+---------+  Mid upper arm        0.30        1.18              +-----------------+-------------+----------+---------+  Dist upper arm       0.33        1.01              +-----------------+-------------+----------+---------+  Antecubital fossa    0.47        0.72              +-----------------+-------------+----------+---------+  Prox forearm         0.27        0.27              +-----------------+-------------+----------+---------+  Mid forearm          0.31         0.38   branching  +-----------------+-------------+----------+---------+  Wrist               0.21  0.22              +-----------------+-------------+----------+---------+   Reviewed fistulogram from July. 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 female with ESRD who presents to discuss permanent access creation.  Previous access surgeries: L cephalic AVF, L AVG and R arm BVT Previous catheters: bilateral IJ Currently dialyzing via Right CFV TDC  We discussed the options of thigh graft versus continuing with her tunneled dialysis catheter.  I discussed the risks and benefits of the graft including the infection risk related to a groin catheter.  At this time she explained that she is tired of surgeries and would like to just continue with the catheter.  I did explain that if it does become infected or clotted she will need intermittent exchanges and her daughter expressed understanding.  Not interested in further surgeries at this time.     Norman GORMAN Serve MD [facility]

## 2024-07-24 ENCOUNTER — Encounter: Payer: Self-pay | Admitting: Vascular Surgery

## 2024-07-24 ENCOUNTER — Ambulatory Visit: Attending: Vascular Surgery | Admitting: Vascular Surgery

## 2024-07-24 VITALS — BP 105/71 | HR 70 | Temp 98.7°F | Resp 20 | Ht 64.2 in | Wt 168.0 lb

## 2024-07-24 DIAGNOSIS — N186 End stage renal disease: Secondary | ICD-10-CM

## 2024-07-29 ENCOUNTER — Encounter

## 2024-07-29 ENCOUNTER — Ambulatory Visit (INDEPENDENT_AMBULATORY_CARE_PROVIDER_SITE_OTHER)

## 2024-07-29 DIAGNOSIS — I442 Atrioventricular block, complete: Secondary | ICD-10-CM

## 2024-07-29 LAB — CUP PACEART REMOTE DEVICE CHECK
Battery Remaining Longevity: 18 mo
Battery Remaining Percentage: 17 %
Brady Statistic RA Percent Paced: 3 %
Brady Statistic RV Percent Paced: 99 %
Date Time Interrogation Session: 20251027013800
Implantable Lead Connection Status: 753985
Implantable Lead Connection Status: 753985
Implantable Lead Implant Date: 20160608
Implantable Lead Implant Date: 20160608
Implantable Lead Location: 753859
Implantable Lead Location: 753860
Implantable Lead Model: 7740
Implantable Lead Model: 7741
Implantable Lead Serial Number: 621979
Implantable Lead Serial Number: 648349
Implantable Pulse Generator Implant Date: 20160608
Lead Channel Impedance Value: 1021 Ohm
Lead Channel Impedance Value: 676 Ohm
Lead Channel Pacing Threshold Amplitude: 1.2 V
Lead Channel Pacing Threshold Pulse Width: 0.4 ms
Lead Channel Setting Pacing Amplitude: 1.6 V
Lead Channel Setting Pacing Amplitude: 2.5 V
Lead Channel Setting Pacing Pulse Width: 0.4 ms
Lead Channel Setting Sensing Sensitivity: 2.5 mV
Pulse Gen Serial Number: 703866
Zone Setting Status: 755011

## 2024-08-01 NOTE — Progress Notes (Signed)
 Remote PPM Transmission

## 2024-08-18 ENCOUNTER — Ambulatory Visit: Payer: Self-pay | Admitting: Cardiology

## 2024-08-29 ENCOUNTER — Encounter

## 2024-09-30 ENCOUNTER — Encounter

## 2024-10-28 ENCOUNTER — Ambulatory Visit

## 2024-10-28 DIAGNOSIS — I442 Atrioventricular block, complete: Secondary | ICD-10-CM

## 2024-10-29 LAB — CUP PACEART REMOTE DEVICE CHECK
Battery Remaining Longevity: 18 mo
Battery Remaining Percentage: 16 %
Brady Statistic RA Percent Paced: 1 %
Brady Statistic RV Percent Paced: 100 %
Date Time Interrogation Session: 20260126012500
Implantable Lead Connection Status: 753985
Implantable Lead Connection Status: 753985
Implantable Lead Implant Date: 20160608
Implantable Lead Implant Date: 20160608
Implantable Lead Location: 753859
Implantable Lead Location: 753860
Implantable Lead Model: 7740
Implantable Lead Model: 7741
Implantable Lead Serial Number: 621979
Implantable Lead Serial Number: 648349
Implantable Pulse Generator Implant Date: 20160608
Lead Channel Impedance Value: 1045 Ohm
Lead Channel Impedance Value: 700 Ohm
Lead Channel Pacing Threshold Amplitude: 1.8 V
Lead Channel Pacing Threshold Pulse Width: 0.4 ms
Lead Channel Setting Pacing Amplitude: 1.4 V
Lead Channel Setting Pacing Amplitude: 2.5 V
Lead Channel Setting Pacing Pulse Width: 0.4 ms
Lead Channel Setting Sensing Sensitivity: 2.5 mV
Pulse Gen Serial Number: 703866
Zone Setting Status: 755011

## 2024-10-31 ENCOUNTER — Encounter

## 2024-11-01 NOTE — Progress Notes (Signed)
 Remote PPM Transmission

## 2024-11-03 ENCOUNTER — Ambulatory Visit: Payer: Self-pay | Admitting: Cardiology

## 2024-12-02 ENCOUNTER — Encounter

## 2025-01-02 ENCOUNTER — Encounter

## 2025-01-27 ENCOUNTER — Encounter

## 2025-04-28 ENCOUNTER — Encounter

## 2025-07-28 ENCOUNTER — Encounter

## 2025-10-27 ENCOUNTER — Encounter
